# Patient Record
Sex: Male | Born: 1955
Health system: Southern US, Community
[De-identification: ages and names within clinical notes are randomized; demographics above are authoritative.]

## PROBLEM LIST (undated history)

## (undated) DIAGNOSIS — I10 Essential (primary) hypertension: Secondary | ICD-10-CM

## (undated) DIAGNOSIS — R42 Dizziness and giddiness: Secondary | ICD-10-CM

## (undated) DIAGNOSIS — K760 Fatty (change of) liver, not elsewhere classified: Secondary | ICD-10-CM

## (undated) DIAGNOSIS — C61 Malignant neoplasm of prostate: Secondary | ICD-10-CM

## (undated) DIAGNOSIS — E785 Hyperlipidemia, unspecified: Secondary | ICD-10-CM

## (undated) DIAGNOSIS — K219 Gastro-esophageal reflux disease without esophagitis: Secondary | ICD-10-CM

## (undated) DIAGNOSIS — J189 Pneumonia, unspecified organism: Secondary | ICD-10-CM

## (undated) DIAGNOSIS — H269 Unspecified cataract: Secondary | ICD-10-CM

## (undated) DIAGNOSIS — R7302 Impaired glucose tolerance (oral): Secondary | ICD-10-CM

## (undated) DIAGNOSIS — M1990 Unspecified osteoarthritis, unspecified site: Secondary | ICD-10-CM

## (undated) DIAGNOSIS — M797 Fibromyalgia: Secondary | ICD-10-CM

## (undated) DIAGNOSIS — H409 Unspecified glaucoma: Secondary | ICD-10-CM

## (undated) HISTORY — DX: Unspecified osteoarthritis, unspecified site: M19.90

## (undated) HISTORY — PX: OTHER SURGICAL HISTORY: SHX169

## (undated) HISTORY — PX: CHOLECYSTECTOMY: SHX55

## (undated) HISTORY — DX: Unspecified cataract: H26.9

## (undated) HISTORY — PX: EYE SURGERY: SHX253

---

## 1999-06-13 ENCOUNTER — Encounter: Payer: Self-pay | Admitting: Emergency Medicine

## 1999-06-13 ENCOUNTER — Inpatient Hospital Stay (HOSPITAL_COMMUNITY): Admission: EM | Admit: 1999-06-13 | Discharge: 1999-06-14 | Payer: Self-pay

## 1999-06-13 ENCOUNTER — Encounter (HOSPITAL_BASED_OUTPATIENT_CLINIC_OR_DEPARTMENT_OTHER): Payer: Self-pay | Admitting: General Surgery

## 2004-02-16 ENCOUNTER — Ambulatory Visit (HOSPITAL_COMMUNITY): Admission: RE | Admit: 2004-02-16 | Discharge: 2004-02-16 | Payer: Self-pay | Admitting: Gastroenterology

## 2012-04-21 ENCOUNTER — Other Ambulatory Visit: Payer: Self-pay | Admitting: Urology

## 2012-05-15 ENCOUNTER — Other Ambulatory Visit (HOSPITAL_COMMUNITY): Payer: Self-pay | Admitting: Urology

## 2012-05-15 NOTE — Patient Instructions (Addendum)
20 MELECIO CUETO  05/15/2012   Your procedure is scheduled on: 05-27-2012  Report to [facility] at 0630 AM.  Call this number if you have problems the morning of surgery [phone]   Remember:   Do not eat food or drink liquids :After Midnight.     Take these medicines the morning of surgery with A SIP OF WATER:   OMEPRAZOLE (PRILOSEC)  AND USE YOUR EYEDROPS TIMOLOL -- AND BRING YOUR EYEDROPS TO THE HOSPITAL.                                SEE [place] PREPARING FOR SURGERY SHEET   Do not wear jewelry, make-up or nail polish.  Do not wear lotions, powders, or perfumes. You may wear deodorant.   Men may shave face and neck.  Do not bring valuables to the hospital.  Contacts, dentures or bridgework may not be worn into surgery.  Leave suitcase in the car. After surgery it may be brought to your room.  For patients admitted to the hospital, checkout time is 11:00 AM the day of discharge.   Patients discharged the day of surgery will not be allowed to drive home.  Name and phone number of your driver:  Special Instructions: N/A   Please read over the following fact sheets that you were given: MRSA Information, blood fact sheet     FAILURE TO FOLLOW THESE INSTRUCTIONS MAY RESULT IN THE CANCELLATION OF YOUR SURGERY. PATIENT SIGNATURE___________________________________________

## 2012-05-18 ENCOUNTER — Encounter (HOSPITAL_COMMUNITY): Payer: Self-pay | Admitting: Pharmacy Technician

## 2012-05-18 ENCOUNTER — Encounter (HOSPITAL_COMMUNITY): Payer: Self-pay

## 2012-05-18 ENCOUNTER — Other Ambulatory Visit (HOSPITAL_COMMUNITY): Payer: Self-pay | Admitting: Urology

## 2012-05-18 ENCOUNTER — Ambulatory Visit (HOSPITAL_COMMUNITY)
Admission: RE | Admit: 2012-05-18 | Discharge: 2012-05-18 | Disposition: A | Discharge status: Home / Self Care | Payer: Managed Care, Other (non HMO) | Source: Ambulatory Visit | Attending: Urology | Admitting: Urology

## 2012-05-18 ENCOUNTER — Encounter (HOSPITAL_COMMUNITY)
Admission: RE | Admit: 2012-05-18 | Discharge: 2012-05-18 | Disposition: A | Discharge status: Home / Self Care | Payer: Managed Care, Other (non HMO) | Source: Ambulatory Visit | Attending: Urology | Admitting: Urology

## 2012-05-18 DIAGNOSIS — C61 Malignant neoplasm of prostate: Secondary | ICD-10-CM | POA: Insufficient documentation

## 2012-05-18 DIAGNOSIS — Z01810 Encounter for preprocedural cardiovascular examination: Secondary | ICD-10-CM | POA: Insufficient documentation

## 2012-05-18 DIAGNOSIS — Z01812 Encounter for preprocedural laboratory examination: Secondary | ICD-10-CM | POA: Insufficient documentation

## 2012-05-18 HISTORY — DX: Unspecified glaucoma: H40.9

## 2012-05-18 HISTORY — DX: Fatty (change of) liver, not elsewhere classified: K76.0

## 2012-05-18 HISTORY — DX: Gastro-esophageal reflux disease without esophagitis: K21.9

## 2012-05-18 HISTORY — DX: Hyperlipidemia, unspecified: E78.5

## 2012-05-18 HISTORY — DX: Impaired glucose tolerance (oral): R73.02

## 2012-05-18 HISTORY — DX: Essential (primary) hypertension: I10

## 2012-05-18 HISTORY — DX: Fibromyalgia: M79.7

## 2012-05-18 HISTORY — DX: Dizziness and giddiness: R42

## 2012-05-18 HISTORY — DX: Pneumonia, unspecified organism: J18.9

## 2012-05-18 LAB — CBC
HCT: 45 % (ref 39.0–52.0)
Hemoglobin: 15.9 g/dL (ref 13.0–17.0)
MCV: 87.5 fL (ref 78.0–100.0)
RBC: 5.14 MIL/uL (ref 4.22–5.81)
WBC: 6.6 10*3/uL (ref 4.0–10.5)

## 2012-05-18 LAB — SURGICAL PCR SCREEN: Staphylococcus aureus: NEGATIVE

## 2012-05-18 LAB — BASIC METABOLIC PANEL
CO2: 24 mEq/L (ref 19–32)
Chloride: 104 mEq/L (ref 96–112)
Creatinine, Ser: 0.9 mg/dL (ref 0.50–1.35)
Sodium: 138 mEq/L (ref 135–145)

## 2012-05-18 NOTE — Pre-Procedure Instructions (Signed)
PREOP CBC, BMET, EKG, CXR WERE DONE TODAY AT [facility] AS PER ANESTHESIOLOGIST'S GUIDELINES.  T/S WILL BE DONE DAY OF SURGERY.

## 2012-05-26 NOTE — H&P (Signed)
Reason For Visit  Patient presents today for robotic-assisted laparoscopic radical retropubic prostatectomy with pelvic lymph node dissection. His prostate cancer history is summarized below.  History of Present Illness        Mr. Jonathan Washington presents today for a extended prostate cancer discussion.  Again, his PSA was actually within normal limits but elevated for his age and there was some concern about PSA velocity.  Digital rectal exam was unremarkable.  Prostate ultrasound revealed a relatively normal-sized prostate around 38 grams without any worrisome features.  Unfortunately, the patient has had biopsies that were positive.  These were limited to the left side.  The patient had a mixture of Gleason 3+4=7, as well as Gleason 3+3=6 cancer.  Four of the 6 biopsies were positive on the left.  Core involvement was between 10% and 40%.  He has had no problems or complications from the biopsy.  The patient is felt to have intermediate risk clinical stage T2c disease.  SHIM = 22/25   AUA =3/0    Past Medical History Problems  1. History of  Glaucoma 365.9 2. History of  Hypertension 401.9  Surgical History Problems  1. History of  Cholecystectomy 2. History of  Knee Surgery  Current Meds 1. Adult Aspirin Low Strength 81 MG Oral Tablet Dispersible; Therapy: (Recorded:20Sep2013) to 2. Diovan 80 MG Oral Tablet; Therapy: 16Jul2013 to 3. Enalapril Maleate 5 MG Oral Tablet; Therapy: 16Jul2013 to 4. Fenofibrate 160 MG Oral Tablet; Therapy: 16Jul2013 to 5. Imipramine HCl 10 MG Oral Tablet; Therapy: 30Jul2013 to 6. Levofloxacin 500 MG Oral Tablet; 1 po q day beginning the day prior to biopsy; Therapy:  20Sep2013 to (Last Rx:20Sep2013) 7. Timolol Maleate 0.5 % Ophthalmic Solution; Therapy: 26Aug2013 to  Allergies Medication  1. No Known Drug Allergies  Family History Problems  1. Paternal history of  Bladder Cancer V16.52 2. Paternal history of  Death In The Family Father 30yrs,  bladder/throat ca 3. Maternal history of  Death In The Family Mother 76yrs, breast ca 4. Paternal history of  Esophageal Cancer V16.0 5. Family history of  Family Health Status Number Of Children 2 sons  Social History Problems  1. Alcohol Use 0-2 qd 2. Caffeine Use 2 qd 3. Former Smoker V15.82 1 ppd for 106yrs, nonsmoker for the past 48yrs 4. Marital History - Currently Married 5. Occupation: Public house manager  Review of Systems Genitourinary, constitutional, skin, eye, otolaryngeal, hematologic/lymphatic, cardiovascular, pulmonary, endocrine, musculoskeletal, gastrointestinal, neurological and psychiatric system(s) were reviewed and pertinent findings if present are noted.  Genitourinary: nocturia.  Constitutional: feeling tired (fatigue).    Vitals Vital Signs [Data Includes: Last 1 Day]  Blood Pressure: 125 / 86 Temperature: 97.8 F Heart Rate: 81  Physical Exam Constitutional: Well nourished and well developed . No acute distress.  ENT:. The ears and nose are normal in appearance.  Neck: The appearance of the neck is normal and no neck mass is present.  Pulmonary: No respiratory distress and normal respiratory rhythm and effort.  Cardiovascular: Heart rate and rhythm are normal . No peripheral edema.  Abdomen: The abdomen is soft and nontender. No masses are palpated. No CVA tenderness. No hernias are palpable. No hepatosplenomegaly noted.  Rectal: Rectal exam demonstrates normal sphincter tone, no tenderness and no masses. Prostate size is estimated to be 25 g. Normal rectal tone, no rectal masses, prostate is smooth, symmetric and non-tender. The prostate has no nodularity and is not tender. The left seminal vesicle is nonpalpable. The right seminal vesicle is nonpalpable.  The perineum is normal on inspection.  Genitourinary: Examination of the penis demonstrates no discharge, no masses, no lesions and a normal meatus. The scrotum is without lesions. The right epididymis  is palpably normal and non-tender. The left epididymis is palpably normal and non-tender. The right testis is non-tender and without masses. The left testis is non-tender and without masses.  Lymphatics: The femoral and inguinal nodes are not enlarged or tender.  Skin: Normal skin turgor, no visible rash and no visible skin lesions.  Neuro/Psych:. Mood and affect are appropriate.     Assessment Assessed  1. Prostate Cancer 185 2. PSA,Elevated 790.93  Plan PSA,Elevated (790.93)  1. Follow-up Schedule Surgery Office  Follow-up  Done: 23Dec2013  Discussion/Summary  The patient was counseled about the natural history of prostate cancer and the standard treatment options that are available for prostate cancer. It was explained to him how his age and life expectancy, clinical stage, Gleason score, and PSA affect his prognosis, the decision to proceed with additional staging studies, as well as how that information influences recommended treatment strategies. We discussed the roles for active surveillance, radiation therapy, surgical therapy, androgen deprivation, as well as ablative therapy options for the treatment of prostate cancer as appropriate to his individual cancer situation. We discussed the risks and benefits of these options with regard to their impact on cancer control and also in terms of potential adverse events, complications, and impact on quiality of life particularly related to urinary, bowel, and sexual function. The patient was encouraged to ask questions throughout the discussion today and all questions were answered to his stated satisfaction. In addition, the patient was provided with and/or directed to appropriate resources and literature for further education about prostate cancer and treatment options.   We discussed surgical therapy for prostate cancer including the different available surgical approaches. We discussed, in detail, the risks and expectations of surgery with  regard to cancer control, urinary control, and erectile function as well as the expected postoperative recovery process. The risks, potential complications/adverse events of radical prostatectomy as well as alternative options were explained to the patient.   We discussed surgical therapy for prostate cancer including the different available surgical approaches. We discussed, in detail, the risks and expectations of surgery with regard to cancer control, urinary control, and erectile function as well as the expected postoperative recovery process. Additional risks of surgery including but not limited to bleeding, infection, hernia formation, nerve damage, lymphocele formation, bowel/rectal injury potentially necessitating colostomy, damage to the urinary tract resulting in urine leakage, urethral stricture, and the cardiopulmonary risks such as myocardial infarction, stroke, death, venothromboembolism, etc. were explained. The risk of open surgical conversion for robotic/laparoscopic prostatectomy was also discussed.  A total of 50 minutes were spent in the overall care of the patient today with 50 minutes in direct face to face consultation.    Amendment  His today was interested in proceeding with robotic prostatectomy. We would plan on a more aggressive nerve spare on the side without cancer and a limited nerve spare on the side with the positive biopsies.

## 2012-05-27 ENCOUNTER — Encounter (HOSPITAL_COMMUNITY): Payer: Self-pay | Admitting: Anesthesiology

## 2012-05-27 ENCOUNTER — Inpatient Hospital Stay (HOSPITAL_COMMUNITY): Payer: Managed Care, Other (non HMO) | Admitting: Anesthesiology

## 2012-05-27 ENCOUNTER — Encounter (HOSPITAL_COMMUNITY): Payer: Self-pay | Admitting: *Deleted

## 2012-05-27 ENCOUNTER — Observation Stay (HOSPITAL_COMMUNITY)
Admission: RE | Admit: 2012-05-27 | Discharge: 2012-05-28 | Disposition: A | Discharge status: Home / Self Care | Payer: Managed Care, Other (non HMO) | Source: Ambulatory Visit | Attending: Urology | Admitting: Urology

## 2012-05-27 ENCOUNTER — Encounter (HOSPITAL_COMMUNITY)
Admission: RE | Disposition: A | Discharge status: Home / Self Care | Payer: Self-pay | Source: Ambulatory Visit | Attending: Urology

## 2012-05-27 DIAGNOSIS — Z8052 Family history of malignant neoplasm of bladder: Secondary | ICD-10-CM | POA: Insufficient documentation

## 2012-05-27 DIAGNOSIS — I10 Essential (primary) hypertension: Secondary | ICD-10-CM | POA: Insufficient documentation

## 2012-05-27 DIAGNOSIS — C775 Secondary and unspecified malignant neoplasm of intrapelvic lymph nodes: Secondary | ICD-10-CM | POA: Insufficient documentation

## 2012-05-27 DIAGNOSIS — Z7982 Long term (current) use of aspirin: Secondary | ICD-10-CM | POA: Insufficient documentation

## 2012-05-27 DIAGNOSIS — C61 Malignant neoplasm of prostate: Principal | ICD-10-CM | POA: Insufficient documentation

## 2012-05-27 DIAGNOSIS — Z79899 Other long term (current) drug therapy: Secondary | ICD-10-CM | POA: Insufficient documentation

## 2012-05-27 DIAGNOSIS — H409 Unspecified glaucoma: Secondary | ICD-10-CM | POA: Insufficient documentation

## 2012-05-27 DIAGNOSIS — Z800 Family history of malignant neoplasm of digestive organs: Secondary | ICD-10-CM | POA: Insufficient documentation

## 2012-05-27 HISTORY — PX: ROBOT ASSISTED LAPAROSCOPIC RADICAL PROSTATECTOMY: SHX5141

## 2012-05-27 HISTORY — PX: LYMPHADENECTOMY: SHX5960

## 2012-05-27 LAB — TYPE AND SCREEN

## 2012-05-27 LAB — ABO/RH: ABO/RH(D): B NEG

## 2012-05-27 SURGERY — ROBOTIC ASSISTED LAPAROSCOPIC RADICAL PROSTATECTOMY
Anesthesia: General | Wound class: Clean Contaminated

## 2012-05-27 MED ORDER — HYDROMORPHONE HCL PF 1 MG/ML IJ SOLN
INTRAMUSCULAR | Status: AC
  Filled 2012-05-27: qty 1

## 2012-05-27 MED ORDER — MENTHOL 3 MG MT LOZG
1.0000 | LOZENGE | OROMUCOSAL | Status: DC | PRN
  Filled 2012-05-27: qty 9

## 2012-05-27 MED ORDER — MIDAZOLAM HCL 5 MG/5ML IJ SOLN
INTRAMUSCULAR | Status: DC | PRN
  Administered 2012-05-27: 2 mg via INTRAVENOUS

## 2012-05-27 MED ORDER — FENTANYL CITRATE 0.05 MG/ML IJ SOLN
INTRAMUSCULAR | Status: DC | PRN
  Administered 2012-05-27 (×5): 50 ug via INTRAVENOUS

## 2012-05-27 MED ORDER — SODIUM CHLORIDE 0.9 % IV BOLUS (SEPSIS)
1000.0000 mL | Freq: Once | INTRAVENOUS | Status: AC
  Administered 2012-05-27: 1000 mL via INTRAVENOUS

## 2012-05-27 MED ORDER — PHENOL 1.4 % MT LIQD
1.0000 | OROMUCOSAL | Status: DC | PRN
  Filled 2012-05-27: qty 177

## 2012-05-27 MED ORDER — ACETAMINOPHEN 10 MG/ML IV SOLN
1000.0000 mg | Freq: Four times a day (QID) | INTRAVENOUS | Status: AC
  Administered 2012-05-27 (×2): 1000 mg via INTRAVENOUS
  Filled 2012-05-27 (×4): qty 100

## 2012-05-27 MED ORDER — IMIPRAMINE HCL 10 MG PO TABS
10.0000 mg | ORAL_TABLET | Freq: Every day | ORAL | Status: DC
  Administered 2012-05-27: 10 mg via ORAL
  Filled 2012-05-27 (×2): qty 1

## 2012-05-27 MED ORDER — ENALAPRIL MALEATE 5 MG PO TABS
5.0000 mg | ORAL_TABLET | Freq: Every morning | ORAL | Status: DC
  Administered 2012-05-27: 5 mg via ORAL
  Filled 2012-05-27 (×2): qty 1

## 2012-05-27 MED ORDER — HYDROCODONE-ACETAMINOPHEN 5-325 MG PO TABS
1.0000 | ORAL_TABLET | ORAL | Status: DC | PRN
  Administered 2012-05-27: 2 via ORAL
  Administered 2012-05-27: 1 via ORAL
  Filled 2012-05-27: qty 2
  Filled 2012-05-27: qty 1

## 2012-05-27 MED ORDER — BUPIVACAINE-EPINEPHRINE 0.25% -1:200000 IJ SOLN
INTRAMUSCULAR | Status: DC | PRN
  Administered 2012-05-27: 26 mL

## 2012-05-27 MED ORDER — STERILE WATER FOR IRRIGATION IR SOLN
Status: DC | PRN
  Administered 2012-05-27: 3000 mL

## 2012-05-27 MED ORDER — SODIUM CHLORIDE 0.9 % IR SOLN
Status: DC | PRN
  Administered 2012-05-27: 1000 mL

## 2012-05-27 MED ORDER — PROMETHAZINE HCL 25 MG/ML IJ SOLN: 6.2500 mg | INTRAMUSCULAR | Status: DC | PRN

## 2012-05-27 MED ORDER — TIMOLOL MALEATE 0.5 % OP SOLN
1.0000 [drp] | Freq: Two times a day (BID) | OPHTHALMIC | Status: DC
  Administered 2012-05-28: 1 [drp] via OPHTHALMIC
  Filled 2012-05-27: qty 5

## 2012-05-27 MED ORDER — INDIGOTINDISULFONATE SODIUM 8 MG/ML IJ SOLN
INTRAMUSCULAR | Status: DC | PRN
  Administered 2012-05-27 (×2): 5 mL via INTRAVENOUS

## 2012-05-27 MED ORDER — DEXAMETHASONE SODIUM PHOSPHATE 10 MG/ML IJ SOLN
INTRAMUSCULAR | Status: DC | PRN
  Administered 2012-05-27: 10 mg via INTRAVENOUS

## 2012-05-27 MED ORDER — IRBESARTAN 75 MG PO TABS
75.0000 mg | ORAL_TABLET | Freq: Every day | ORAL | Status: DC
  Administered 2012-05-27: 75 mg via ORAL
  Filled 2012-05-27 (×2): qty 1

## 2012-05-27 MED ORDER — HYDROMORPHONE HCL PF 1 MG/ML IJ SOLN
0.2500 mg | INTRAMUSCULAR | Status: DC | PRN
  Administered 2012-05-27 (×3): 0.5 mg via INTRAVENOUS

## 2012-05-27 MED ORDER — FENOFIBRATE 160 MG PO TABS
160.0000 mg | ORAL_TABLET | Freq: Every evening | ORAL | Status: DC
  Administered 2012-05-27: 160 mg via ORAL
  Filled 2012-05-27 (×2): qty 1

## 2012-05-27 MED ORDER — CIPROFLOXACIN HCL 500 MG PO TABS: 500.0000 mg | ORAL_TABLET | Freq: Two times a day (BID) | ORAL | Status: DC

## 2012-05-27 MED ORDER — PROPOFOL 10 MG/ML IV BOLUS
INTRAVENOUS | Status: DC | PRN
  Administered 2012-05-27: 200 mg via INTRAVENOUS

## 2012-05-27 MED ORDER — LACTATED RINGERS IV SOLN
INTRAVENOUS | Status: DC | PRN
  Administered 2012-05-27: 09:00:00

## 2012-05-27 MED ORDER — MORPHINE SULFATE 2 MG/ML IJ SOLN
2.0000 mg | INTRAMUSCULAR | Status: DC | PRN
  Administered 2012-05-27 – 2012-05-28 (×3): 2 mg via INTRAVENOUS
  Filled 2012-05-27 (×3): qty 1

## 2012-05-27 MED ORDER — PANTOPRAZOLE SODIUM 40 MG PO TBEC
40.0000 mg | DELAYED_RELEASE_TABLET | Freq: Every day | ORAL | Status: DC
  Administered 2012-05-28: 40 mg via ORAL
  Filled 2012-05-27: qty 1

## 2012-05-27 MED ORDER — LIDOCAINE HCL (CARDIAC) 20 MG/ML IV SOLN
INTRAVENOUS | Status: DC | PRN
  Administered 2012-05-27: 100 mg via INTRAVENOUS

## 2012-05-27 MED ORDER — KCL IN DEXTROSE-NACL 10-5-0.45 MEQ/L-%-% IV SOLN
INTRAVENOUS | Status: AC
  Filled 2012-05-27: qty 1000

## 2012-05-27 MED ORDER — LACTATED RINGERS IV SOLN
INTRAVENOUS | Status: DC | PRN
  Administered 2012-05-27 (×2): via INTRAVENOUS

## 2012-05-27 MED ORDER — SUCCINYLCHOLINE CHLORIDE 20 MG/ML IJ SOLN
INTRAMUSCULAR | Status: DC | PRN
  Administered 2012-05-27: 100 mg via INTRAVENOUS

## 2012-05-27 MED ORDER — HYDROCODONE-ACETAMINOPHEN 5-325 MG PO TABS: 1.0000 | ORAL_TABLET | Freq: Four times a day (QID) | ORAL | Status: DC | PRN

## 2012-05-27 MED ORDER — GLYCOPYRROLATE 0.2 MG/ML IJ SOLN
INTRAMUSCULAR | Status: DC | PRN
  Administered 2012-05-27: 0.6 mg via INTRAVENOUS

## 2012-05-27 MED ORDER — EPHEDRINE SULFATE 50 MG/ML IJ SOLN
INTRAMUSCULAR | Status: DC | PRN
  Administered 2012-05-27: 5 mg via INTRAVENOUS

## 2012-05-27 MED ORDER — KCL IN DEXTROSE-NACL 10-5-0.45 MEQ/L-%-% IV SOLN
INTRAVENOUS | Status: DC
  Administered 2012-05-27 – 2012-05-28 (×3): via INTRAVENOUS
  Filled 2012-05-27 (×5): qty 1000

## 2012-05-27 MED ORDER — NEOSTIGMINE METHYLSULFATE 1 MG/ML IJ SOLN
INTRAMUSCULAR | Status: DC | PRN
  Administered 2012-05-27: 4 mg via INTRAVENOUS

## 2012-05-27 MED ORDER — ACETAMINOPHEN 10 MG/ML IV SOLN
INTRAVENOUS | Status: DC | PRN
  Administered 2012-05-27: 1000 mg via INTRAVENOUS

## 2012-05-27 MED ORDER — ROCURONIUM BROMIDE 100 MG/10ML IV SOLN
INTRAVENOUS | Status: DC | PRN
  Administered 2012-05-27: 50 mg via INTRAVENOUS
  Administered 2012-05-27 (×2): 10 mg via INTRAVENOUS
  Administered 2012-05-27: 5 mg via INTRAVENOUS

## 2012-05-27 MED ORDER — CEFAZOLIN SODIUM-DEXTROSE 2-3 GM-% IV SOLR
2.0000 g | INTRAVENOUS | Status: AC
  Administered 2012-05-27: 2 g via INTRAVENOUS

## 2012-05-27 MED ORDER — ONDANSETRON HCL 4 MG/2ML IJ SOLN
INTRAMUSCULAR | Status: DC | PRN
  Administered 2012-05-27: 4 mg via INTRAVENOUS

## 2012-05-27 MED ORDER — ZOLPIDEM TARTRATE 5 MG PO TABS
5.0000 mg | ORAL_TABLET | Freq: Every evening | ORAL | Status: DC | PRN
  Administered 2012-05-27: 5 mg via ORAL
  Filled 2012-05-27: qty 1

## 2012-05-27 SURGICAL SUPPLY — 49 items
CANISTER SUCTION 2500CC (MISCELLANEOUS) ×3 IMPLANT
CATH FOLEY 2WAY SLVR  5CC 20FR (CATHETERS)
CATH FOLEY 2WAY SLVR 18FR 30CC (CATHETERS) ×3 IMPLANT
CATH FOLEY 2WAY SLVR 5CC 20FR (CATHETERS) IMPLANT
CATH ROBINSON RED A/P 16FR (CATHETERS) ×3 IMPLANT
CATH ROBINSON RED A/P 8FR (CATHETERS) ×3 IMPLANT
CATH TIEMANN FOLEY 18FR 5CC (CATHETERS) ×3 IMPLANT
CHLORAPREP W/TINT 26ML (MISCELLANEOUS) ×3 IMPLANT
CLIP LIGATING HEM O LOK PURPLE (MISCELLANEOUS) IMPLANT
CLOTH BEACON ORANGE TIMEOUT ST (SAFETY) ×3 IMPLANT
CORD HIGH FREQUENCY UNIPOLAR (ELECTROSURGICAL) ×3 IMPLANT
COVER SURGICAL LIGHT HANDLE (MISCELLANEOUS) ×6 IMPLANT
COVER TIP SHEARS 8 DVNC (MISCELLANEOUS) ×2 IMPLANT
COVER TIP SHEARS 8MM DA VINCI (MISCELLANEOUS) ×1
CUTTER ECHEON FLEX ENDO 45 340 (ENDOMECHANICALS) ×3 IMPLANT
DECANTER SPIKE VIAL GLASS SM (MISCELLANEOUS) ×3 IMPLANT
DRAPE SURG IRRIG POUCH 19X23 (DRAPES) ×3 IMPLANT
DRAPE UTILITY 15X26 (DRAPE) ×3 IMPLANT
DRSG TEGADERM 2-3/8X2-3/4 SM (GAUZE/BANDAGES/DRESSINGS) ×15 IMPLANT
DRSG TEGADERM 4X4.75 (GAUZE/BANDAGES/DRESSINGS) ×6 IMPLANT
DRSG TEGADERM 6X8 (GAUZE/BANDAGES/DRESSINGS) ×6 IMPLANT
ELECT REM PT RETURN 9FT ADLT (ELECTROSURGICAL) ×3
ELECTRODE REM PT RTRN 9FT ADLT (ELECTROSURGICAL) ×2 IMPLANT
GAUZE SPONGE 2X2 8PLY STRL LF (GAUZE/BANDAGES/DRESSINGS) IMPLANT
GLOVE BIO SURGEON STRL SZ 6.5 (GLOVE) ×3 IMPLANT
GLOVE BIOGEL M STRL SZ7.5 (GLOVE) ×6 IMPLANT
GOWN PREVENTION PLUS XLARGE (GOWN DISPOSABLE) ×6 IMPLANT
GOWN STRL NON-REIN LRG LVL3 (GOWN DISPOSABLE) ×3 IMPLANT
GOWN STRL REIN XL XLG (GOWN DISPOSABLE) ×3 IMPLANT
HOLDER FOLEY CATH W/STRAP (MISCELLANEOUS) ×3 IMPLANT
IV LACTATED RINGERS 1000ML (IV SOLUTION) IMPLANT
KIT ACCESSORY DA VINCI DISP (KITS) ×1
KIT ACCESSORY DVNC DISP (KITS) ×2 IMPLANT
NDL SAFETY ECLIPSE 18X1.5 (NEEDLE) ×2 IMPLANT
NEEDLE HYPO 18GX1.5 SHARP (NEEDLE) ×1
PACK ROBOT UROLOGY CUSTOM (CUSTOM PROCEDURE TRAY) ×3 IMPLANT
RELOAD GREEN ECHELON 45 (STAPLE) ×3 IMPLANT
SCISSORS LAP 5X35 DISP (ENDOMECHANICALS) ×3 IMPLANT
SEALER TISSUE G2 CVD JAW 45CM (ENDOMECHANICALS) ×3 IMPLANT
SET TUBE IRRIG SUCTION NO TIP (IRRIGATION / IRRIGATOR) ×3 IMPLANT
SOLUTION ELECTROLUBE (MISCELLANEOUS) ×3 IMPLANT
SPONGE GAUZE 2X2 STER 10/PKG (GAUZE/BANDAGES/DRESSINGS)
SUT ETHILON 3 0 PS 1 (SUTURE) ×3 IMPLANT
SUT VIC AB 2-0 SH 27 (SUTURE) ×1
SUT VIC AB 2-0 SH 27X BRD (SUTURE) ×2 IMPLANT
SUT VICRYL 0 UR6 27IN ABS (SUTURE) ×3 IMPLANT
SYR 27GX1/2 1ML LL SAFETY (SYRINGE) ×3 IMPLANT
TOWEL OR NON WOVEN STRL DISP B (DISPOSABLE) ×3 IMPLANT
WATER STERILE IRR 1500ML POUR (IV SOLUTION) IMPLANT

## 2012-05-27 NOTE — Care Management Note (Signed)
    Page 1 of 1   05/28/2012     11:42:30 AM   CARE MANAGEMENT NOTE 05/28/2012  Patient:  Jonathan Washington, Jonathan Washington   Account Number:  [id number]  Date Initiated:  05/27/2012  Documentation initiated by:  Lanier Clam  Subjective/Objective Assessment:   ADMITTED W/PROSTATE CA.     Action/Plan:   FROM HOME W/SPOUSE.HAS PCP,PHARMACY.   Anticipated DC Date:  05/28/2012   Anticipated DC Plan:  HOME/SELF CARE      DC Planning Services  CM consult      Choice offered to / List presented to:             Status of service:  Completed, signed off Medicare Important Message given?   (If response is "NO", the following Medicare IM given date fields will be blank) Date Medicare IM given:   Date Additional Medicare IM given:    Discharge Disposition:  HOME/SELF CARE  Per UR Regulation:  Reviewed for med. necessity/level of care/duration of stay  If discussed at Long Length of Stay Meetings, dates discussed:    Comments:  05/28/12 Jontrell Bushong RN,BSN NCM [phone] D/C HOME NO NEEDS OR ORDERS.  05/27/12 Jerald Villalona RN,BSN NCM [phone] S/P ROBOTIC LAP ASST RADICAL RETROPUBIC PROSTATECTOMY.

## 2012-05-27 NOTE — Transfer of Care (Signed)
Immediate Anesthesia Transfer of Care Note  Patient: Jonathan Washington  Procedure(s) Performed: Procedure(s) (LRB) with comments: ROBOTIC ASSISTED LAPAROSCOPIC RADICAL PROSTATECTOMY (N/A) LYMPHADENECTOMY (Bilateral)  Patient Location: PACU  Anesthesia Type:General  Level of Consciousness: sedated  Airway & Oxygen Therapy: Patient Spontanous Breathing and Patient connected to face mask oxygen  Post-op Assessment: Report given to PACU RN and Post -op Vital signs reviewed and stable  Post vital signs: Reviewed and stable  Complications: No apparent anesthesia complications

## 2012-05-27 NOTE — Interval H&P Note (Signed)
History and Physical Interval Note:  05/27/2012 8:21 AM  Jonathan Washington  has presented today for surgery, with the diagnosis of PROSTATE CANCER  The various methods of treatment have been discussed with the patient and family. After consideration of risks, benefits and other options for treatment, the patient has consented to  Procedure(s) (LRB) with comments: ROBOTIC ASSISTED LAPAROSCOPIC RADICAL PROSTATECTOMY (N/A) - 3.5 HRS BILATERAL PELVIC LYMPH NODE DISSECTION  LYMPHADENECTOMY (Bilateral) as a surgical intervention .  The patient's history has been reviewed, patient examined, no change in status, stable for surgery.  I have reviewed the patient's chart and labs.  Questions were answered to the patient's satisfaction.     Haik Mahoney S

## 2012-05-27 NOTE — Op Note (Signed)
Preoperative diagnosis: Clinical stage T1c Adenocarcinoma prostate  Postoperative diagnosis: Same  Procedure: Robotic-assisted laparoscopic radical retropubic prostatectomy with bilateral pelvic lymph node dissection  Surgeon: Valetta Fuller, MD  Asst.: Pecola Leisure, PA Anesthesia: Gen. Endotracheal  Indications: Patient was diagnosed with clinical stage TIc Adenocarcinoma the prostate. He underwent extensive consultation with regard to treatment options. The patient decided on a surgical approach. He appeared to understand the distinct advantages as well as the disadvantages of this procedure. The patient has performed a mechanical bowel prep. He has had placement of PAS compression boots and has received perioperative antibiotics. The patient's preoperative PSA was 3-4. Ultrasound revealed a 38 g prostate.   Technique and findings:The patient was brought to the operating room and had successful induction of general endotracheal anesthesia.the patient was placed in a low lithotomy position with careful padding of all extremities. He was secured to the operative table and placed in the steep Trendelenburg position. He was prepped and draped in usual manner. A Foley catheter was placed sterilely on the field. Camera port site was chosen 18 cm above the pubic symphysis just to the left of the umbilicus. A standard open Hassan technique was utilized. A 12 mm trocar was placed without difficulty. The camera was then inserted and no abnormalities were noted within the pelvis. The trochars were placed with direct visual guidance. This included 3 8mm robotic trochars and a 12 mm and 5 mm assist ports. Once all the ports were placed the robot was docked. The bladder was filled and the space of Retzius was developed with electrocautery dissection as well as blunt dissection. Superficial fat off the endopelvic fascia and bladder neck was removed with electrocautery scissors. The endopelvic fascia was then incised  bilaterally from base to apex. Levator musculature was swept off the apex of the prostate isolating the dorsal venous complex which was then stapled with the ETS stapling device. The anterior bladder neck was identified with the aid of the Foley balloon. This was then transected down to the Foley catheter with electrocautery scissors. The Foley catheter was then retracted anteriorly. Indigo carmine was given and we appeared to be well away from the ureteral orifices. The posterior bladder neck was then transected and the dissection carried down to the adnexal structures. The seminal vesicles and vas deferens on both sides were then individually dissected free and retracted anteriorly. The posterior plane between the rectum and prostate was then established primarily with blunt dissection.  Attention was then turned towards nerve sparing. The patient was felt to be a candidate for right-sided nerve sparing and limited left-sided nerve sparing. Superficial fascia along the anterior lateral aspect of the prostate was incised bilaterally. This tissue was then swept laterally until we were able to establish a groove between the neurovascular tissue and the posterior lateral aspect on the prostate bilaterally. This groove was then extended from the apex back to the base of the prostate. With the prostate retracted anteriorly the vascular pedicles of the prostate were taken with the Enseal device. The Foley catheter was then reinserted and the anterior urethra was transected. The posterior urethra was then transected as were some rectourethralis fibers. The prostate was then removed from the pelvis. The pelvis was then copiously irrigated. Rectal insufflation was performed and there was no evidence of rectal injury.  Attention was then turned towards bilateral pelvic lymph node dissection. The obturator node packets were removed I laterally and the dissection extended towards the bifurcation of the iliac artery. The  obturator nerve was identified on both sides and preserved. Hemalock clips were used for small veins and lymphatic channels. The node packets were sent for permanent analysis.  Attention was then turned towards reconstruction. The bladder neck did not require any reconstruction. The bladder neck and posterior urethra were reapproximated at the 6:00 position utilizing a 2-0 Vicryl suture. The rest of the anastomosis was done with a double-armed 3-0 Monocryl suture in a 360 degree manner. Additional indigo carmine was given. A new catheter was placed and bladder irrigation revealed no evidence of leakage. A Blake drain was placed through one of the robotic trochars and positioned in the retropubic space above the anastomosis. This was then secured to the skin with a nylon suture. The prostate was placed in the Endopouch retrieval bag. The 12 mm trocar site was closed with a Vicryl suture with the aid of a suture passer. Our other trochars were taken out with direct visual guidance without evidence of any bleeding. The camera port incision was extended slightly to allow for removal of the specimen and then closed with a running Vicryl suture. All port sites were infiltrated with Marcaine and then closed with surgical clips. The patient was then taken to recovery room having had no obvious complications or problems. Sponge and needle counts were correct.

## 2012-05-27 NOTE — Progress Notes (Signed)
Patient ID: TALAL FRITCHMAN, male   DOB: 22-Jul-1955, 57 y.o.   MRN: [id number] Post-op note  Subjective: The patient is doing well.  No complaints.  Objective: Vital signs in last 24 hours: Temp:  [97.3 F (36.3 C)-97.9 F (36.6 C)] 97.9 F (36.6 C) (02/05 1234) Pulse Rate:  [69-83] 73  (02/05 1234) Resp:  [11-22] 16  (02/05 1234) BP: (104-124)/(69-81) 117/74 mmHg (02/05 1234) SpO2:  [99 %-100 %] 100 % (02/05 1234)  Intake/Output from previous day:   Intake/Output this shift: Total I/O In: 2400 [I.V.:1400; IV Piggyback:1000] Out: 240 [Urine:80; Drains:60; Blood:100]  Physical Exam:  General: Alert and oriented. Abdomen: Soft, Nondistended. Incisions: Clean and dry.  Lab Results:  Basename 05/27/12 1150  HGB 13.2  HCT 37.6*    Assessment/Plan: POD#0   1) Continue to monitor 2.) amb, IS, pain control, DVT prophy    LOS: 0 days   Silas Flood. 05/27/2012, 2:17 PM

## 2012-05-27 NOTE — Anesthesia Preprocedure Evaluation (Signed)
Anesthesia Evaluation  Patient identified by MRN, date of birth, ID band Patient awake    Reviewed: Allergy & Precautions, H&P , NPO status , Patient's Chart, lab work & pertinent test results  Airway Mallampati: II TM Distance: >3 FB Neck ROM: Full    Dental No notable dental hx.    Pulmonary neg pulmonary ROS,  breath sounds clear to auscultation  Pulmonary exam normal       Cardiovascular hypertension, Pt. on medications Rhythm:Regular Rate:Normal     Neuro/Psych negative neurological ROS  negative psych ROS   GI/Hepatic Neg liver ROS, GERD-  ,  Endo/Other  negative endocrine ROS  Renal/GU negative Renal ROS  negative genitourinary   Musculoskeletal negative musculoskeletal ROS (+)   Abdominal   Peds negative pediatric ROS (+)  Hematology negative hematology ROS (+)   Anesthesia Other Findings   Reproductive/Obstetrics negative OB ROS                           Anesthesia Physical Anesthesia Plan  ASA: II  Anesthesia Plan: General   Post-op Pain Management:    Induction: Intravenous  Airway Management Planned: Oral ETT  Additional Equipment:   Intra-op Plan:   Post-operative Plan: Extubation in OR  Informed Consent: I have reviewed the patients History and Physical, chart, labs and discussed the procedure including the risks, benefits and alternatives for the proposed anesthesia with the patient or authorized representative who has indicated his/her understanding and acceptance.   Dental advisory given  Plan Discussed with: CRNA and Surgeon  Anesthesia Plan Comments:         Anesthesia Quick Evaluation  

## 2012-05-27 NOTE — Anesthesia Procedure Notes (Signed)
Procedure Name: Intubation Date/Time: 05/27/2012 8:34 AM Performed by: Doran Clay Pre-anesthesia Checklist: Patient identified, Timeout performed, Emergency Drugs available, Suction available and Patient being monitored Patient Re-evaluated:Patient Re-evaluated prior to inductionOxygen Delivery Method: Circle system utilized Preoxygenation: Pre-oxygenation with 100% oxygen Intubation Type: IV induction Ventilation: Mask ventilation without difficulty Laryngoscope Size: Mac and 4 Grade View: Grade I Tube type: Oral Tube size: 7.5 mm Number of attempts: 1 Airway Equipment and Method: Stylet Placement Confirmation: ETT inserted through vocal cords under direct vision,  breath sounds checked- equal and bilateral and positive ETCO2 Secured at: 23 cm Tube secured with: Tape Dental Injury: Teeth and Oropharynx as per pre-operative assessment

## 2012-05-28 ENCOUNTER — Encounter (HOSPITAL_COMMUNITY): Payer: Self-pay | Admitting: Urology

## 2012-05-28 LAB — BASIC METABOLIC PANEL
BUN: 10 mg/dL (ref 6–23)
CO2: 26 mEq/L (ref 19–32)
Chloride: 103 mEq/L (ref 96–112)
GFR calc non Af Amer: >90 mL/min (ref >90)
Glucose, Bld: 153 mg/dL — ABNORMAL HIGH (ref 70–99)
Potassium: 3.8 mEq/L (ref 3.5–5.1)
Sodium: 137 mEq/L (ref 135–145)

## 2012-05-28 LAB — CREATININE, FLUID (PLEURAL, PERITONEAL, JP DRAINAGE): Creat, Fluid: 0.8 mg/dL

## 2012-05-28 LAB — HEMOGLOBIN AND HEMATOCRIT, BLOOD: HCT: 38.6 % — ABNORMAL LOW (ref 39.0–52.0)

## 2012-05-28 MED ORDER — BISACODYL 10 MG RE SUPP
10.0000 mg | Freq: Once | RECTAL | Status: AC
  Administered 2012-05-28: 10 mg via RECTAL
  Filled 2012-05-28: qty 1

## 2012-05-28 MED ORDER — OXYCODONE HCL 5 MG PO TABS
10.0000 mg | ORAL_TABLET | ORAL | Status: DC | PRN
  Administered 2012-05-28 (×2): 10 mg via ORAL
  Filled 2012-05-28 (×2): qty 2

## 2012-05-28 NOTE — Progress Notes (Signed)
1 Day Post-Op Subjective: Patient reports pain control adequate. Need Oxy IR due to max tylenol  Objective: Vital signs in last 24 hours: Temp:  [97.4 F (36.3 C)-98.4 F (36.9 C)] 98.1 F (36.7 C) (02/06 0511) Pulse Rate:  [64-87] 79  (02/06 0511) Resp:  [11-22] 16  (02/06 0511) BP: (85-124)/(51-81) 94/56 mmHg (02/06 0511) SpO2:  [96 %-100 %] 96 % (02/06 0511) Weight:  [89.359 kg (197 lb)-91.082 kg (200 lb 12.8 oz)] 89.359 kg (197 lb) (02/05 1900)  Intake/Output from previous day: 02/05 0701 - 02/06 0700 In: 6332.1 [P.O.:1680; I.V.:3652.1; IV Piggyback:1000] Out: 6410 [Urine:6105; Drains:205; Blood:100] Intake/Output this shift: Total I/O In: -  Out: 900 [Urine:900]  Physical Exam:  General:alert and cooperative Cardiovascular: rrr Lungs: nl effort GI: not done and soft, non tender, normal bowel sounds, no palpable masses, no organomegaly, no inguinal hernia Incisions:  Urine: Extremities:  Lab Results:  Basename 05/28/12 0501 05/27/12 1150  HGB 13.7 13.2  HCT 38.6* 37.6*   BMET  Basename 05/28/12 0501  NA 137  K 3.8  CL 103  CO2 26  GLUCOSE 153*  BUN 10  CREATININE 0.84  CALCIUM 8.3*   No results found for this basename: LABPT:3,INR:3 in the last 72 hours No results found for this basename: LABURIN:1 in the last 72 hours Results for orders placed during the hospital encounter of 05/18/12  SURGICAL PCR SCREEN     Status: Normal   Collection Time   05/18/12  1:40 PM      Component Value Range Status Comment   MRSA, PCR NEGATIVE  NEGATIVE Final    Staphylococcus aureus NEGATIVE  NEGATIVE Final     Studies/Results: No results found.  Assessment/Plan: 1 Day Post-Op, Procedure(s) (LRB): ROBOTIC ASSISTED LAPAROSCOPIC RADICAL PROSTATECTOMY (N/A) LYMPHADENECTOMY (Bilateral)  Transition to PO pain medications Doubt leak but will check JP creatinine and then probably DC Probable DC home later today   LOS: 1 day   Leslea Vowles S 05/28/2012, 7:46  AM

## 2012-05-28 NOTE — Discharge Summary (Signed)
  Date of admission: 05/27/2012  Date of discharge: 05/28/2012  Admission diagnosis: Prostate Cancer  Discharge diagnosis: Prostate Cancer  History and Physical: For full details, please see admission history and physical. Briefly, Jonathan Washington is a 57 y.o. gentleman with localized prostate cancer.  After discussing management/treatment options, he elected to proceed with surgical treatment.  Hospital Course: HAZAEL OLVEDA was taken to the operating room on 05/27/2012 and underwent a robotic assisted laparoscopic radical prostatectomy. He tolerated this procedure well and without complications. Postoperatively, he was able to be transferred to a regular hospital room following recovery from anesthesia.  He was able to begin ambulating the night of surgery. He remained hemodynamically stable overnight.  He had excellent urine output with appropriately minimal output from his pelvic drain and his pelvic drain was removed on POD #1.  He was transitioned to oral pain medication, tolerated a clear liquid diet, and had met all discharge criteria and was able to be discharged home later on POD#1.  Laboratory values:  Basename 05/28/12 0501 05/27/12 1150  HGB 13.7 13.2  HCT 38.6* 37.6*    Disposition: Home  Discharge instruction: He was instructed to be ambulatory but to refrain from heavy lifting, strenuous activity, or driving. He was instructed on urethral catheter care.  Discharge medications:     Medication List     As of 05/28/2012 11:11 AM    START taking these medications         ciprofloxacin 500 MG tablet   Commonly known as: CIPRO   Take 1 tablet (500 mg total) by mouth 2 (two) times daily. Start day prior to office visit for foley removal      HYDROcodone-acetaminophen 5-325 MG per tablet   Commonly known as: NORCO/VICODIN   Take 1-2 tablets by mouth every 6 (six) hours as needed for pain.      CONTINUE taking these medications         enalapril 5 MG tablet   Commonly known as: VASOTEC      fenofibrate 160 MG tablet      imipramine 10 MG tablet   Commonly known as: TOFRANIL      omeprazole 20 MG capsule   Commonly known as: PRILOSEC      timolol 0.5 % ophthalmic solution   Commonly known as: TIMOPTIC      valsartan 80 MG tablet   Commonly known as: DIOVAN      STOP taking these medications         aspirin EC 81 MG tablet      ibuprofen 200 MG tablet   Commonly known as: ADVIL,MOTRIN          Where to get your medications    These are the prescriptions that you need to pick up.   You may get these medications from any pharmacy.         ciprofloxacin 500 MG tablet   HYDROcodone-acetaminophen 5-325 MG per tablet            Followup: He will followup in 1 week for catheter removal and to discuss his surgical pathology results.

## 2012-06-08 NOTE — Anesthesia Postprocedure Evaluation (Signed)
  Anesthesia Post-op Note  Patient: Jonathan Washington  Procedure(s) Performed: Procedure(s) (LRB): ROBOTIC ASSISTED LAPAROSCOPIC RADICAL PROSTATECTOMY (N/A) LYMPHADENECTOMY (Bilateral)  Patient Location: PACU  Anesthesia Type: General  Level of Consciousness: awake and alert   Airway and Oxygen Therapy: Patient Spontanous Breathing  Post-op Pain: mild  Post-op Assessment: Post-op Vital signs reviewed, Patient's Cardiovascular Status Stable, Respiratory Function Stable, Patent Airway and No signs of Nausea or vomiting  Last Vitals:  Filed Vitals:   05/28/12 1350  BP: 127/82  Pulse: 79  Temp: 36.3 C  Resp: 20    Post-op Vital Signs: stable   Complications: No apparent anesthesia complications

## 2017-05-15 ENCOUNTER — Encounter: Payer: Federal, State, Local not specified - PPO | Attending: Family Medicine | Admitting: Registered"

## 2017-05-15 ENCOUNTER — Encounter: Payer: Self-pay | Admitting: Registered"

## 2017-05-15 DIAGNOSIS — Z713 Dietary counseling and surveillance: Secondary | ICD-10-CM | POA: Insufficient documentation

## 2017-05-15 DIAGNOSIS — R7301 Impaired fasting glucose: Secondary | ICD-10-CM | POA: Diagnosis not present

## 2017-05-15 NOTE — Patient Instructions (Signed)
Make sure to get in three meals per day. Try to have balanced meals like the plate example. Have a consistent intake of carbohydrates at meals-recommend 4-5 carbohydrate choices per meal. Could do 3-4 per meal if 4-5 is too much food, but want to keep it consistent at each meal. 1 carbohydrate paired with protein for a snack.    Try to include heart healthy fats (see handout) and less saturated fat.   Strongly recommend avoiding alcohol to help with lowering triglycerides and for liver health.   Calorieking.com -Good resource to check nutrition at restaurants.   Continue making physical activity a part of your week. Try to include at least 30 minutes of physical activity 5 days each week or at least 150 minutes per week. Regular physical activity promotes overall health-including helping to reduce risk for heart disease and diabetes, promoting mental health, and helping [place] sleep better.

## 2017-05-15 NOTE — Progress Notes (Signed)
Medical Nutrition Therapy:  Appt start time: 1600 end time:  8315.   Assessment:  Primary concerns today: Pt referred due to impaired fasting glucose. Noted pt has a medical hx of fatty liver, HTN, and dyslipidemia. Pt present for appointment with wife. Pt goes by Jonathan Washington. Wife reports that pt's last A1c was WNL but pt has had two fasting glucose readings above 126 and pt's doctor talked to them about new dx of type 2 diabetes per pt's wife. Pt and wife report that they try to eat healthy. Pt reports tiredness. He is unsure if he has ever had an occurrence of hypoglycemia. Reports he may have at some time while working. Pt wants to know if he should start checking his blood sugar at home. Pt reports that he drinks alcohol-2-3 beers about 2 times per week. Pt reports that he does not like to he [place] food without drinking beer with it. Pt reports that he takes pomegranate extract and drinks pomegranate juice.   Preferred Learning Style:   No preference indicated   Learning Readiness:   Ready  MEDICATIONS: See list.    DIETARY INTAKE:  Usual eating pattern includes 1-2 meals and 2 snacks per day. Typical snacks include fruit, protein bar, popcorn, lightly salted nuts.   Everyday foods vary.  Avoided foods include pineapple, ground [place]. Beverages include water, Diet soda, sometimes lite beer about 2 times per week-2-3 beers per pt.    24-hr recall:  B ( AM): 2 pieces of bacon, 2 fried eggs in olive oil, 2 cups coffee with half and half and stevia, Pomegranate Juice Snk ( AM): None reported.  L ( PM): egg salad on whole wheat 45 kcal per slice bread, Veggie Straws, granola bar, Diet soda  Snk ( PM): Pure protein bar D ( PM): leftover [place] fajita, pinto beans, wild rice, salad with 1 tbsp ranch dressing, water, 1 piece sweet potato pie   Snk ( PM): orange Beverages: coffee, pomegranate juice, diet soda, water   Usual physical activity: class at [facility]- 2 days per for 1 hour-week-cardio,  core, and toning; walk about 30 minutes 3 days per week, outdoor work as weather permits.   Estimated energy needs: 2000-2200 calories 225-248 g carbohydrates 125-138 g protein 67-73 g fat   Progress Towards Goal(s):  In progress.   Nutritional Diagnosis:  NB-1.1 Food and nutrition-related knowledge deficit As related to carbohydrate counting, and diabetes self management.  As evidenced by pt unaware of which foods contain carbohydrates, how to read a nutrition label to assess carbohydrate content, and relationship between carbohydrate intake and blood sugar.    Intervention:  Nutrition counseling provided. Dietitian provided diabetes self management education-carbohydrate counting, label reading, hypoglycemia and hyperglycemia and management, relationship between dietary intake and exercise on blood sugar, and heart healthy nutrition. Discussed checking feet regularly. Discussed that pt checking blood sugar at home could be very helpful in blood sugar management. Pt plans to do so. Discussed effects of alcohol on liver and recommended pt avoid alcohol. Discussed that pomegranate juice can interact with some of pt's medications.   Goals/Instructions:   Make sure to get in three meals per day. Try to have balanced meals like the plate example. Have a consistent intake of carbohydrates at meals-recommend 4-5 carbohydrate choices per meal. Could do 3-4 per meal if 4-5 is too much food, but want to keep it consistent at each meal. 1 carbohydrate paired with protein for a snack.    Try to include heart healthy  fats (see handout) and less saturated fat.   Strongly recommend avoiding alcohol to help with lowering triglycerides and for liver health.   Calorieking.com -Good resource to check nutrition at restaurants.   Continue making physical activity a part of your week. Try to include at least 30 minutes of physical activity 5 days each week or at least 150 minutes per week. Regular physical  activity promotes overall health-including helping to reduce risk for heart disease and diabetes, promoting mental health, and helping [place] sleep better.    Teaching Method Utilized:  Visual Auditory Handouts given during visit include:  Living Well with Diabetes   Heart Healthy Nutrition   Low Carbohydrate Snacks   Barriers to learning/adherence to lifestyle change: None indicated.   Demonstrated degree of understanding via:  Teach Back   Monitoring/Evaluation:  Dietary intake, exercise, and body weight prn.

## 2018-01-06 DIAGNOSIS — R42 Dizziness and giddiness: Secondary | ICD-10-CM | POA: Insufficient documentation

## 2018-01-06 DIAGNOSIS — H903 Sensorineural hearing loss, bilateral: Secondary | ICD-10-CM | POA: Insufficient documentation

## 2018-07-09 ENCOUNTER — Other Ambulatory Visit: Payer: Self-pay | Admitting: Optometry

## 2018-08-10 ENCOUNTER — Encounter: Payer: Self-pay | Admitting: Radiation Oncology

## 2018-08-10 NOTE — Progress Notes (Signed)
GU Location of Tumor / Histology: Biochemical recurrent prostate cancer  If Prostate Cancer, Gleason Score is (3 + 3) and PSA is (3.7)pre treatment.   06/23/2018 PSA  0.36  Jonathan Washington was diagnosed with prostate cancer in 2014. He had a prostatectomy the same year. Unfortunately, a positive node was found.       Past/Anticipated interventions by urology, if any: prostatectomy (05/2012), active surveillance, denies that any imaging has ever been done, referral for consideration of radiotherapy  Past/Anticipated interventions by medical oncology, if any: no  Weight changes, if any: no  Bowel/Bladder complaints, if any: IPSS 7. SHIM 18. Sexually active with aid of Viagra. Denies urinary incontinence. Reports minimal leakage. Denies dysuria or hematuria. Reports a weak urine stream and nocturia.    Nausea/Vomiting, if any: no  Pain issues, if any:  no  SAFETY ISSUES:  Prior radiation? no  Pacemaker/ICD? no  Possible current pregnancy? no, male patient  Is the patient on methotrexate? no  Current Complaints / other details:  63 year old male. NKDA. Mother and father both with hx of ca.

## 2018-08-11 ENCOUNTER — Other Ambulatory Visit: Payer: Self-pay

## 2018-08-11 ENCOUNTER — Encounter: Payer: Self-pay | Admitting: Radiation Oncology

## 2018-08-11 ENCOUNTER — Ambulatory Visit
Admission: RE | Admit: 2018-08-11 | Discharge: 2018-08-11 | Disposition: A | Discharge status: Home / Self Care | Payer: Medicare Other | Source: Ambulatory Visit | Attending: Radiation Oncology | Admitting: Radiation Oncology

## 2018-08-11 ENCOUNTER — Other Ambulatory Visit: Payer: Self-pay | Admitting: Urology

## 2018-08-11 VITALS — Ht 71.0 in | Wt 202.0 lb

## 2018-08-11 DIAGNOSIS — C61 Malignant neoplasm of prostate: Secondary | ICD-10-CM | POA: Insufficient documentation

## 2018-08-11 HISTORY — DX: Malignant neoplasm of prostate: C61

## 2018-08-11 NOTE — Progress Notes (Signed)
Radiation Oncology         [phone] ________________________________  Initial Outpatient Consultation - Conducted via WebEx due to current COVID-19 concerns for limiting patient exposure  Name: Jonathan Washington MRN: [id number]  Date: 08/11/2018  DOB: [birth year]  PP:IRJJOA, Lennette Bihari, MD  Hulan Fess, MD   REFERRING PHYSICIAN: Hulan Fess, MD  DIAGNOSIS: 63 y.o. gentleman with a biochemical recurrence of pT2b N1 MX Gleason 3+3 adenocarcinoma of the prostate with current PSA of 0.36.    ICD-10-CM   1. Malignant neoplasm of prostate ([place]) C61     HISTORY OF PRESENT ILLNESS: Jonathan Washington is a 63 y.o. male with a diagnosis of biochemically recurrent prostate cancer. His prostate cancer was originally diagnosed in November 2013 as Gleason 3+4 with pre-treatment PSA of 3.7. He underwent radical prostatectomy and bilateral lymphadenectomy with Dr. Risa Grill on 05/27/2012. Final pathology revealed pT2b, Gleason 3+3 prostatic adenocarcinoma involving the left lobe. Apical and bladder neck margins were negative. Seminal vesicles were negative.  No extracapsular prostatic extension was identified. Perineural invasion and lymphovascular invasion were identified. One left pelvic lymph node was positive for metastatic disease (pN1). PSA 5 weeks out from surgery was 0.04. Tumor board discussed whole pelvic radiation, but no strong consensus was reached. Additional PSA's were stable around 0.04 until January 2016, it increased to 0.07 and has continued to rise since then:  PSA History: 05/2018: PSA 0.36 11/2017: PSA 0.24 04/2017: PSA 0.14 10/2016: PSA 0.14 04/2016: PSA 0.11 10/2015: PSA 0.08 04/2015: PSA 0.07 10/2014: PSA 0.05 07/2014: PSA 0.04 04/2014: PSA 0.07  The patient reviewed the PSA results with his urologist Dr. Louis Meckel, and he has kindly been referred today for discussion of potential radiation treatment options. The patient denies having undergone any imaging.    PREVIOUS  RADIATION THERAPY: No  PAST MEDICAL HISTORY:  Past Medical History:  Diagnosis Date   Dyslipidemia    Fatty liver    Fibromyalgia    GERD (gastroesophageal reflux disease)    Glaucoma    RIGHT EYE   Glucose intolerance (impaired glucose tolerance)    Hypertension    Pneumonia    4 YRS AGO   Prostate cancer ([place])    Vertigo    ESPECIALLY IF PT TURNS HIS HEAD OR MOVES A CERTAIN WAY      PAST SURGICAL HISTORY: Past Surgical History:  Procedure Laterality Date   CHOLECYSTECTOMY     LEFT KNEE ARTHROSCOPY     LYMPHADENECTOMY  05/27/2012   Procedure: LYMPHADENECTOMY;  Surgeon: Bernestine Amass, MD;  Location: [facility];  Service: Urology;  Laterality: Bilateral;   ROBOT ASSISTED LAPAROSCOPIC RADICAL PROSTATECTOMY  05/27/2012   Procedure: ROBOTIC ASSISTED LAPAROSCOPIC RADICAL PROSTATECTOMY;  Surgeon: Bernestine Amass, MD;  Location: [facility];  Service: Urology;  Laterality: N/A;   SURGERY FOR DEVIATED NASAL SEPTUM      FAMILY HISTORY:  Family History  Problem Relation Age of Onset   Breast cancer Mother    Bladder Cancer Father    Throat cancer Father    Lung cancer Paternal Aunt        non smoker    SOCIAL HISTORY:  Social History   Socioeconomic History   Marital status: Married    Spouse name: Not on file   Number of children: 2   Years of education: Not on file   Highest education level: Not on file  Occupational History    Comment: retired since 2015  Social Needs   Financial resource strain: Not  on file   Food insecurity:    Worry: Not on file    Inability: Not on file   Transportation needs:    Medical: Not on file    Non-medical: Not on file  Tobacco Use   Smoking status: Former Smoker    Packs/day: 2.00    Years: 15.00    Pack years: 30.00    Types: Cigarettes    Last attempt to quit: 04/22/1978    Years since quitting: 40.3   Smokeless tobacco: Never Used   Tobacco comment: [facility] [facility]  Substance and Sexual Activity    Alcohol use: Yes    Comment: 1 0R 2 BEERS DAILY   Drug use: No   Sexual activity: Yes    Comment: with aid of Viagra  Lifestyle   Physical activity:    Days per week: Not on file    Minutes per session: Not on file   Stress: Not on file  Relationships   Social connections:    Talks on phone: Not on file    Gets together: Not on file    Attends religious service: Not on file    Active member of club or organization: Not on file    Attends meetings of clubs or organizations: Not on file    Relationship status: Not on file   Intimate partner violence:    Fear of current or ex partner: Not on file    Emotionally abused: Not on file    Physically abused: Not on file    Forced sexual activity: Not on file  Other Topics Concern   Not on file  Social History Narrative   Not on file    ALLERGIES: Patient has no known allergies.  MEDICATIONS:  Current Outpatient Medications  Medication Sig Dispense Refill   AMLODIPINE BENZOATE PO Take 2.5 mg by mouth daily.     aspirin 81 MG tablet Take 81 mg by mouth.     enalapril (VASOTEC) 5 MG tablet Take 5 mg by mouth every morning.     imipramine (TOFRANIL) 10 MG tablet Take 10 mg by mouth at bedtime.     losartan (COZAAR) 100 MG tablet Take 100 mg by mouth daily.     Multiple Vitamins-Minerals (MULTIVITAMIN ADULT PO) Take by mouth.     Pomegranate, [place] gran[place], (POMEGRANATE EXTRACT PO) Take by mouth.     rosuvastatin (CRESTOR) 5 MG tablet Take 5 mg by mouth daily.     sildenafil (VIAGRA) 100 MG tablet Take 100 mg by mouth daily as needed for erectile dysfunction.     timolol (TIMOPTIC) 0.5 % ophthalmic solution Place 1 drop into the right eye 2 (two) times daily.     KRILL OIL PO Take by mouth.     omeprazole (PRILOSEC) 20 MG capsule Take 20 mg by mouth daily as needed. For heartburn     No current facility-administered medications for this encounter.     REVIEW OF SYSTEMS:  On review of systems, the patient  reports that he is doing well overall. He denies any chest pain, shortness of breath, cough, fevers, chills, night sweats, or unintended weight changes. He denies any bowel disturbances, and denies abdominal pain, nausea or vomiting. He denies any new musculoskeletal or joint aches or pains. His IPSS was 7, indicating mild urinary symptoms with minimal leakage, weak stream, and nocturia. He denies incontinence, dysuria, or hematuria. His SHIM was 18, indicating he does have mild erectile dysfunction. He reports he is sexually active with  aid of Viagra. A complete review of systems is obtained and is otherwise negative.    PHYSICAL EXAM:  Wt Readings from Last 3 Encounters:  08/11/18 202 lb (91.6 kg)  05/15/17 197 lb 3.2 oz (89.4 kg)  05/27/12 197 lb (89.4 kg)   Temp Readings from Last 3 Encounters:  05/28/12 97.3 F (36.3 C) (Oral)  05/18/12 98.5 F (36.9 C) (Oral)   BP Readings from Last 3 Encounters:  05/28/12 127/82  05/18/12 120/81   Pulse Readings from Last 3 Encounters:  05/28/12 79  05/18/12 82   Pain Assessment Pain Score: 0-No pain/10  In general this is a well appearing caucasian male in no acute distress. He is alert and oriented x4. Remainder of exam deferred in light of virtual consultation platform.    KPS = 100  100 - Normal; no complaints; no evidence of disease. 90   - Able to carry on normal activity; minor signs or symptoms of disease. 80   - Normal activity with effort; some signs or symptoms of disease. 40   - Cares for self; unable to carry on normal activity or to do active work. 60   - Requires occasional assistance, but is able to care for most of his personal needs. 50   - Requires considerable assistance and frequent medical care. 30   - Disabled; requires special care and assistance. 31   - Severely disabled; hospital admission is indicated although death not imminent. 17   - Very sick; hospital admission necessary; active supportive treatment  necessary. 10   - Moribund; fatal processes progressing rapidly. 0     - Dead  Karnofsky DA, Abelmann Pitkin, Craver LS and Burchenal [facility] [phone]) The use of the nitrogen mustards in the palliative treatment of carcinoma: with particular reference to bronchogenic carcinoma Cancer 1 634-56  LABORATORY DATA:  Lab Results  Component Value Date   WBC 6.6 05/18/2012   HGB 13.7 05/28/2012   HCT 38.6 (L) 05/28/2012   MCV 87.5 05/18/2012   PLT 261 05/18/2012   Lab Results  Component Value Date   NA 137 05/28/2012   K 3.8 05/28/2012   CL 103 05/28/2012   CO2 26 05/28/2012   No results found for: ALT, AST, GGT, ALKPHOS, BILITOT   RADIOGRAPHY: No results found.    IMPRESSION/PLAN: 1. 63 y.o. gentleman with a biochemical recurrence of pT2b N1 MX Gleason 3+3 adenocarcinoma of the prostate with current PSA of 0.36.  Today we reviewed the findings and workup thus far.  We discussed the natural history of biochemically recurrent prostate cancer.  We reviewed the implications of positive lymph nodes and rising, detectable postoperative PSA on the risk of prostate cancer recurrence. We discussed radiation treatment directed to the prostatic fossa and pelvic lymph nodes with regard to the logistics and delivery of external beam radiation treatment. We also discussed use of ADT in combination with salvage XRT to the prostate fossa and pelvic lymph nodes in the setting of biochemical recurrence.  It is felt that the toxicities associated with ADT and the negative impact it has on quality of life for patients with lower risk, Gleason 6 disease with positive lymph node involvement and several years out from surgery, may outweigh the overall benefits of the treatment. Therefore, we recommend proceeding with Axumin PET scan for restaging prior to making final treatment recommendations. Once we have those results, we will follow up with the patient to discuss and make a final decision on proceeding with salvage XRT +/-  ST-ADT treatment. The patient was encouraged to ask questions that were answered to his stated satisfaction and advised to call with any additional questions or concerns in the interim.  We will share this discussion with Dr. Louis Meckel and keep him informed as we move forward.  This encounter was provided by telemedicine platform Webex.  The patient has given verbal consent for this type of encounter and has been advised to only accept a meeting of this type in a secure network environment. The time spent during this encounter was 60 minutes. The attendants for this meeting include Tyler Pita MD, [address] PA-C, scribe Clinton Sawyer, and patient, DINH AYOTTE.  During the encounter, Tyler Pita MD, Ashlyn Bruning PA-C, and scribe Clinton Sawyer were located at [facility] Radiation Oncology Department.  Patient Jonathan Washington was located at home.     Nicholos Johns, PA-C    Tyler Pita, MD  [facility] Direct Dial: [phone]   Fax: [phone] [email or web address]   Skype   [email or web address]  This document serves as a record of services personally performed by Tyler Pita, MD and Freeman Caldron, PA-C. It was created on their behalf by Rae Lips, a trained medical scribe. The creation of this record is based on the scribe's personal observations and the providers' statements to them. This document has been checked and approved by the attending providers.

## 2018-08-11 NOTE — Progress Notes (Signed)
See progress note under physician encounter. 

## 2018-08-12 ENCOUNTER — Telehealth: Payer: Self-pay | Admitting: *Deleted

## 2018-08-12 NOTE — Telephone Encounter (Signed)
CALLED PATIENT TO INFORM OF PET SCAN FOR 08-18-18 - ARRIVAL TIME - 1:30 PM @ [facility], PT. TO NOT HAVE ANYTHING BY MOUTH - 6 HRS. PRIOR TO TEST, PATIENT CANNOT SUCK ANY HARD CANDY OR CHEW ANY GUM, SPOKE WITH PATIENT AND HE IS AWARE OF THIS TEST

## 2018-08-18 ENCOUNTER — Ambulatory Visit (HOSPITAL_COMMUNITY): Payer: Medicare Other

## 2018-08-18 ENCOUNTER — Encounter: Payer: Self-pay | Admitting: Urology

## 2018-08-18 ENCOUNTER — Telehealth: Payer: Self-pay | Admitting: *Deleted

## 2018-08-18 ENCOUNTER — Telehealth: Payer: Self-pay | Admitting: Medical Oncology

## 2018-08-18 NOTE — Progress Notes (Signed)
Axumin PET rescheduled to 08/25/18 at 4pm.

## 2018-08-18 NOTE — Telephone Encounter (Signed)
Called to introduce myself  as the prostate nurse navigator and my role. I was unable to meet him 4/21 during consult with Dr. Tammi Klippel due to consult being conducted via WebEx due to current COVID-19 concerns for limiting patient exposure. He is post prostatectomy 2014 with negative margins and positive lymph node, now with rising PSA. He is scheduled for PET scan 5/5 and follow up with Ashlyn 5/12. I informed him I am working remotely and asked him to leave questions or concerns on my office voice mail and I will return his call.

## 2018-08-18 NOTE — Telephone Encounter (Signed)
Called patient to inform to [facility] appt. For 09-01-18 - 1 pm  For 1:30 pm appt. With [employer], spoke with patient and he is aware of this appt.

## 2018-08-18 NOTE — Telephone Encounter (Signed)
Called patient to inform of Pet Scan for 08-25-18 - arrival time - 3:30 pm @ [facility], patient to be NPO- 6 hrs. prior to test and patient should not exercise - 24 hrs. prior to test, lvm for a return call

## 2018-08-21 ENCOUNTER — Institutional Professional Consult (permissible substitution): Payer: Federal, State, Local not specified - PPO | Admitting: Radiation Oncology

## 2018-08-25 ENCOUNTER — Other Ambulatory Visit: Payer: Self-pay

## 2018-08-25 ENCOUNTER — Encounter (HOSPITAL_COMMUNITY)
Admission: RE | Admit: 2018-08-25 | Discharge: 2018-08-25 | Disposition: A | Discharge status: Home / Self Care | Payer: Medicare Other | Source: Ambulatory Visit | Attending: Urology | Admitting: Urology

## 2018-08-25 DIAGNOSIS — C61 Malignant neoplasm of prostate: Secondary | ICD-10-CM | POA: Diagnosis not present

## 2018-08-25 MED ORDER — AXUMIN (FLUCICLOVINE F 18) INJECTION
10.2000 | Freq: Once | INTRAVENOUS | Status: AC
  Administered 2018-08-25: 10.2 via INTRAVENOUS

## 2018-09-01 ENCOUNTER — Other Ambulatory Visit: Payer: Self-pay

## 2018-09-01 ENCOUNTER — Ambulatory Visit
Admission: RE | Admit: 2018-09-01 | Discharge: 2018-09-01 | Disposition: A | Discharge status: Home / Self Care | Payer: Medicare Other | Source: Ambulatory Visit | Attending: Urology | Admitting: Urology

## 2018-09-01 ENCOUNTER — Encounter: Payer: Self-pay | Admitting: Urology

## 2018-09-01 VITALS — Ht 71.0 in | Wt 202.0 lb

## 2018-09-01 DIAGNOSIS — C61 Malignant neoplasm of prostate: Secondary | ICD-10-CM

## 2018-09-01 NOTE — Progress Notes (Signed)
Follow up new consult today with Jonathan Bruning, Jonathan Washington to review PET scan results which were negative for metastatic disease.   GU Location of Tumor / Histology: Biochemical recurrent prostate cancer  If Prostate Cancer, Gleason Score is (3 + 3) and PSA is (3.7)pre treatment.   06/23/2018          PSA                 0.36  Jonathan Washington was diagnosed with prostate cancer in 2014. He had a prostatectomy the same year. Unfortunately, a positive node was found.       Past/Anticipated interventions by urology, if any: prostatectomy (05/2012), active surveillance, denies that any imaging has ever been done, referral for consideration of radiotherapy  Past/Anticipated interventions by medical oncology, if any: no  Weight changes, if any: no  Bowel/Bladder complaints, if any: IPSS 7. SHIM 18. Sexually active with aid of Viagra. Denies urinary incontinence. Reports minimal leakage. Denies dysuria or hematuria. Reports a weak urine stream and nocturia.    Nausea/Vomiting, if any: no  Pain issues, if any:  no  SAFETY ISSUES:  Prior radiation? no  Pacemaker/ICD? no  Possible current pregnancy? no, male patient  Is the patient on methotrexate? no  Current Complaints / other details:  63 year old male. NKDA. Mother and father both with hx of ca.

## 2018-09-01 NOTE — Progress Notes (Signed)
See progress note under physician encounter. 

## 2018-09-01 NOTE — Progress Notes (Signed)
Radiation Oncology         (336) [phone] ________________________________  Name: Jonathan Washington MRN: [id number]  Date: 09/01/2018  DOB: 02/15/1956  Follow-Up New Visit Note - Conducted via WebEx due to current COVID-19 concerns for limiting patient exposure  CC: Hulan Fess, MD  Ardis Hughs, MD  Diagnosis:   63 y.o. gentleman with a biochemical recurrence of pT2b N1 MX Gleason 3+3 adenocarcinoma of the prostate with current PSA of 0.36    ICD-10-CM   1. Malignant neoplasm of prostate ([place]) C61    Narrative:  The patient returns today for re-evaluation of his prostate cancer following Axumin PET scan performed 08/25/2018.  This scan showed no evidence of local recurrence in the prostatectomy bed, metastatic lymphadenopathy, soft tissue or skeletal metastasis.       In summary,  his prostate cancer was originally diagnosed in November 2013 as Gleason 3+4 with pre-treatment PSA of 3.7. He underwent radical prostatectomy and bilateral lymphadenectomy with Dr. Risa Grill on 05/27/2012. Final pathology revealed pT2b, Gleason 3+3 prostatic adenocarcinoma involving the left lobe with ECE, PNI, LVI and one left pelvic lymph node positive for metastatic disease (pN1). Surgical margins were negative and there was no seminal vesicle involvement. His initial post-operative PSA was 0.04. His case was presented at the multidisciplinary prostate cancer tumor board regarding adjuvant prostate fossa radiation, but no strong consensus was reached. After further discussion with his urologist, the decision was to proceed with close monitoring of the PSA.  His subsequent PSA values remained stable around 0.04 until January 2016, when it increased to 0.07 and has continued to gradually rise since that time as below:  PSA History: 05/2018:          PSA 0.36 11/2017:          PSA 0.24 04/2017:          PSA 0.14 10/2016:          PSA 0.14 04/2016:          PSA 0.11 10/2015:          PSA 0.08 04/2015:           PSA 0.07 10/2014:          PSA 0.05 07/2014:          PSA 0.04 04/2014:          PSA 0.07     The patient returns today for further discussion of his recent [facility] [place] PET and final treatment reccommendations.       On review of systems, the patient reports that he is doing well overall. His IPSS was 7, indicating mild urinary symptoms with minimal leakage, weak stream, and nocturia. He denies incontinence, dysuria, or hematuria. His SHIM was 18, indicating he does have mild erectile dysfunction. He is sexually active with aid of Viagra.   ALLERGIES:  has No Known Allergies.  Meds: Current Outpatient Medications  Medication Sig Dispense Refill  . amLODipine (NORVASC) 2.5 MG tablet TAKE 1 TABLET BY MOUTH ONCE DAILY FOR 30 DAYS    . aspirin 81 MG tablet Take 81 mg by mouth.    . enalapril (VASOTEC) 5 MG tablet Take 5 mg by mouth every morning.    Marland Kitchen imipramine (TOFRANIL) 10 MG tablet Take 10 mg by mouth at bedtime.    Marland Kitchen KRILL OIL PO Take by mouth.    . losartan (COZAAR) 100 MG tablet Take 100 mg by mouth daily.    . Multiple Vitamins-Minerals (MULTIVITAMIN ADULT PO)  Take by mouth.    Marland Kitchen omeprazole (PRILOSEC) 20 MG capsule Take 20 mg by mouth daily as needed. For heartburn    . Pomegranate, Punica granatum, (POMEGRANATE EXTRACT PO) Take by mouth.    . rosuvastatin (CRESTOR) 5 MG tablet Take 5 mg by mouth daily.    . sildenafil (VIAGRA) 100 MG tablet Take 100 mg by mouth daily as needed for erectile dysfunction.    . timolol (TIMOPTIC) 0.5 % ophthalmic solution Place 1 drop into the right eye 2 (two) times daily.     No current facility-administered medications for this encounter.     Physical Findings: In general this is a well appearing caucasian male in no acute distress. He's alert and oriented x4 and appropriate throughout the examination. Cardiopulmonary assessment is negative for acute distress and he exhibits normal effort.  Remainder of exam not performed in light of virtual WebEX  visit platform.  vitals were not taken for this visit.   Lab Findings: Lab Results  Component Value Date   WBC 6.6 05/18/2012   HGB 13.7 05/28/2012   HCT 38.6 (L) 05/28/2012   PLT 261 05/18/2012    Lab Results  Component Value Date   NA 137 05/28/2012   K 3.8 05/28/2012   CO2 26 05/28/2012   GLUCOSE 153 (H) 05/28/2012   BUN 10 05/28/2012   CREATININE 0.84 05/28/2012   CALCIUM 8.3 (L) 05/28/2012    Radiographic Findings: Nm Pet (axumin) Skull Base To Mid Thigh  Result Date: 08/26/2018 CLINICAL DATA:  Prostate carcinoma with biochemical recurrence. T2 N1. Gleason 3 + 3 adenocarcinoma. Radical prostatectomy. EXAM: NUCLEAR MEDICINE PET SKULL BASE TO THIGH TECHNIQUE: 10.2 mCi F-18 Fluciclovine was injected intravenously. Full-ring PET imaging was performed from the skull base to thigh after the radiotracer. CT data was obtained and used for attenuation correction and anatomic localization. COMPARISON:  None. FINDINGS: NECK No radiotracer activity in neck lymph nodes. Incidental CT finding: None CHEST No radiotracer accumulation within mediastinal or hilar lymph nodes. No suspicious pulmonary nodules on the CT scan. Incidental CT finding: None ABDOMEN/PELVIS Prostate: No focal activity prostatectomy bed. Lymph nodes: No abnormal radiotracer accumulation within pelvic or abdominal nodes. Liver: No evidence of liver metastasis Incidental CT finding: Mild hepatic steatosis.  Postcholecystectomy SKELETON No focal  activity to suggest skeletal metastasis. IMPRESSION: 1. No evidence of local recurrence in the prostatectomy bed. 2. No evidence of metastatic lymphadenopathy. 3. No soft tissue metastasis or skeletal metastasis Electronically Signed   By: Suzy Bouchard M.D.   On: 08/26/2018 08:59    Impression:  63 y.o. gentleman with a biochemical recurrence of pT2b N1 MX Gleason 3+3 adenocarcinoma of the prostate with current PSA of 0.36.  This visit was conducted via WebEx to spare the patient  unnecessary potential exposure in the healthcare setting during the current COVID-19 pandemic.  Today we reviewed the findings and workup thus far. The patient's recent [place] PET was personally reviewed by Dr. Tammi Klippel who participated in formalizing a treatment plan.  We reviewed the natural history of biochemically recurrent prostate cancer as well as the implications of positive lymph nodes and rising, detectable postoperative PSA on the risk of prostate cancer recurrence. We discussed radiation treatment directed to the prostatic fossa and pelvic lymph nodes with regard to the logistics and delivery of external beam radiation treatment. We also revisited the topic of ADT use in combination with salvage XRT to the prostate fossa and pelvic lymph nodes in the setting of biochemical recurrence.After thorough  discussion, it is felt that the toxicities associated with ADT and the negative impact it has on quality of life for patients with lowerrisk,Gleason 6 diseasewith a single positive lymph node and very slow, gradual rise in PSA several years out from surgery,may outweigh the overall benefits of the treatment. Additionally, given the lack of measurable disease on recent Axumin PET scan, the patient prefers to defer the use of ADT for now and reserve for use only if the PSA continues to rise despite salvage radiotherapy.  However, he states that he would be open to further conversation with Dr. Louis Meckel if he feels strongly that ADT is indicated.  Plan:  The patient elects to proceed with 7.5 weeks of salvage external beam radiation therapy directed to the prostatic fossa and pelvic lymph nodes without addition of ADT. I will share this discussion with Dr. Louis Meckel and move forward with treatment planning in anticipation of beginning radiation in the near future. The patient would also like to have a repeat PSA to obtain a baseline before beginning treatment since it has now been 4 months since his last  PSA evaluation.  This encounter was provided by telemedicine platform Webex.  The patient has given verbal consent for this type of encounter and has been advised to only accept a meeting of this type in a secure network environment. The time spent during this encounter was 30 minutes. The attendants for this meeting include [employer], scribe Clinton Sawyer, and patient, Jonathan Washington.  During the encounter, Otha Monical PA-C and scribe Clinton Sawyer were located at [facility] Radiation Oncology Department.  Patient, Jonathan Washington was located at home.    Nicholos Johns, MMS, PA-C [place] at [place]: [phone]  Fax: [phone]  This document serves as a record of services personally performed by Ireanna Finlayson, PA-C. It was created on her behalf by Rae Lips, a trained medical scribe. The creation of this record is based on the scribe's personal observations and the provider's statements to them. This document has been checked and approved by the attending provider.

## 2018-09-02 ENCOUNTER — Telehealth: Payer: Self-pay | Admitting: Urology

## 2018-09-02 ENCOUNTER — Telehealth: Payer: Self-pay | Admitting: *Deleted

## 2018-09-02 NOTE — Telephone Encounter (Signed)
I spoke with the patient's wife to inform that I had communicated with Dr. Louis Meckel who agrees with proceeding with salvage XRT to prostate fossa and nodes without ADT, reserving ADT for use if the PSA continues to rise despite XRT.  Dr. Carlton Adam nurse will contact them to schedule a lab only visit within the next week to establish a baseline prior to starting treatment.  He will have CT SIM on 09/11/18 at 9am for treatment planning in anticipation of beginning treatment 09/21/18.  She is comfortable with and in agreement with the stated pan and will share this information with her husband as soon as he returns. They are advised to call with any additional questions or concerns in the interim.  Nicholos Johns, MMS, PA-C [place] at [place]: [phone]  Fax: [phone]

## 2018-09-02 NOTE — Telephone Encounter (Signed)
Called patient to inform of lab appt. for repeat PSA on Tuesday May 19- arrival time - 10:45 am @ Dr. Carlton Adam Office, lvm for a return call

## 2018-09-10 ENCOUNTER — Telehealth: Payer: Self-pay | Admitting: Medical Oncology

## 2018-09-10 NOTE — Progress Notes (Signed)
  Radiation Oncology         [phone]) [phone] ________________________________  Name: Jonathan Washington MRN: [id number]  Date: 09/11/2018  DOB: 1956-04-15  SIMULATION AND TREATMENT PLANNING NOTE    ICD-10-CM   1. Malignant neoplasm of prostate ([place]) C61     DIAGNOSIS:  63 y.o. gentleman with a biochemical recurrence of pT2b N1 MX Gleason 3+3 adenocarcinoma of the prostate with current PSA of 0.36  NARRATIVE:  The patient was brought to the [facility].  Identity was confirmed.  All relevant records and images related to the planned course of therapy were reviewed.  The patient freely provided informed written consent to proceed with treatment after reviewing the details related to the planned course of therapy. The consent form was witnessed and verified by the simulation staff.  Then, the patient was set-up in a stable reproducible supine position for radiation therapy.  A vacuum lock pillow device was custom fabricated to position his legs in a reproducible immobilized position.  Then, I performed a urethrogram under sterile conditions to identify the prostatic bed.  CT images were obtained.  Surface markings were placed.  The CT images were loaded into the planning software.  Then the prostate bed target, pelvic lymph node target and avoidance structures including the rectum, bladder, bowel and hips were contoured.  Treatment planning then occurred.  The radiation prescription was entered and confirmed.  A total of one complex treatment devices were fabricated. I have requested : Intensity Modulated Radiotherapy (IMRT) is medically necessary for this case for the following reason:  Rectal sparing.Marland Kitchen  PLAN:  The patient will receive 45 Gy in 25 fractions of 1.8 Gy, followed by a boost to the prostate bed to a total dose of 68.4 Gy with 13 additional fractions of 1.8 Gy.   ________________________________  Sheral Apley Tammi Klippel, M.D.

## 2018-09-10 NOTE — Telephone Encounter (Signed)
Patient called stating he has an appointment for CT simulation at 9:00 am in the am. His My Chart keeps telling him the appointment has been changed but the  date and time is the same. I confirmed his simulation is 5/22 at 9 am. He voiced understanding.

## 2018-09-11 ENCOUNTER — Encounter: Payer: Self-pay | Admitting: Medical Oncology

## 2018-09-11 ENCOUNTER — Other Ambulatory Visit: Payer: Self-pay

## 2018-09-11 ENCOUNTER — Encounter: Payer: Self-pay | Admitting: Urology

## 2018-09-11 ENCOUNTER — Ambulatory Visit
Admission: RE | Admit: 2018-09-11 | Discharge: 2018-09-11 | Disposition: A | Discharge status: Home / Self Care | Payer: Medicare Other | Source: Ambulatory Visit | Attending: Radiation Oncology | Admitting: Radiation Oncology

## 2018-09-11 DIAGNOSIS — C61 Malignant neoplasm of prostate: Secondary | ICD-10-CM

## 2018-09-11 DIAGNOSIS — Z510 Encounter for antineoplastic radiation therapy: Secondary | ICD-10-CM | POA: Insufficient documentation

## 2018-09-11 NOTE — Progress Notes (Signed)
Repeat PSA on 09/08/2018 was further elevated at 0.48, previously 0.36 in February 2020.   He had CT simulation/treatment planning earlier today and is scheduled to begin his 7-1/2-week course of salvage radiotherapy to the prostate fossa on 09/23/2018.  Nicholos Johns, MMS, PA-C [place] at [place]: [phone]  Fax: [phone]

## 2018-09-17 DIAGNOSIS — Z510 Encounter for antineoplastic radiation therapy: Secondary | ICD-10-CM | POA: Diagnosis not present

## 2018-09-23 ENCOUNTER — Other Ambulatory Visit: Payer: Self-pay

## 2018-09-23 ENCOUNTER — Ambulatory Visit
Admission: RE | Admit: 2018-09-23 | Discharge: 2018-09-23 | Disposition: A | Discharge status: Home / Self Care | Payer: Medicare Other | Source: Ambulatory Visit | Attending: Radiation Oncology | Admitting: Radiation Oncology

## 2018-09-23 DIAGNOSIS — C61 Malignant neoplasm of prostate: Secondary | ICD-10-CM | POA: Insufficient documentation

## 2018-09-23 DIAGNOSIS — Z510 Encounter for antineoplastic radiation therapy: Secondary | ICD-10-CM | POA: Diagnosis present

## 2018-09-24 ENCOUNTER — Other Ambulatory Visit: Payer: Self-pay

## 2018-09-24 ENCOUNTER — Ambulatory Visit
Admission: RE | Admit: 2018-09-24 | Discharge: 2018-09-24 | Disposition: A | Discharge status: Home / Self Care | Payer: Medicare Other | Source: Ambulatory Visit | Attending: Radiation Oncology | Admitting: Radiation Oncology

## 2018-09-24 DIAGNOSIS — Z510 Encounter for antineoplastic radiation therapy: Secondary | ICD-10-CM | POA: Diagnosis not present

## 2018-09-25 ENCOUNTER — Other Ambulatory Visit: Payer: Self-pay

## 2018-09-25 ENCOUNTER — Ambulatory Visit
Admission: RE | Admit: 2018-09-25 | Discharge: 2018-09-25 | Disposition: A | Discharge status: Home / Self Care | Payer: Medicare Other | Source: Ambulatory Visit | Attending: Radiation Oncology | Admitting: Radiation Oncology

## 2018-09-25 DIAGNOSIS — Z510 Encounter for antineoplastic radiation therapy: Secondary | ICD-10-CM | POA: Diagnosis not present

## 2018-09-28 ENCOUNTER — Other Ambulatory Visit: Payer: Self-pay

## 2018-09-28 ENCOUNTER — Ambulatory Visit
Admission: RE | Admit: 2018-09-28 | Discharge: 2018-09-28 | Disposition: A | Discharge status: Home / Self Care | Payer: Medicare Other | Source: Ambulatory Visit | Attending: Radiation Oncology | Admitting: Radiation Oncology

## 2018-09-28 DIAGNOSIS — Z510 Encounter for antineoplastic radiation therapy: Secondary | ICD-10-CM | POA: Diagnosis not present

## 2018-09-29 ENCOUNTER — Other Ambulatory Visit: Payer: Self-pay

## 2018-09-29 ENCOUNTER — Ambulatory Visit
Admission: RE | Admit: 2018-09-29 | Discharge: 2018-09-29 | Disposition: A | Discharge status: Home / Self Care | Payer: Medicare Other | Source: Ambulatory Visit | Attending: Radiation Oncology | Admitting: Radiation Oncology

## 2018-09-29 DIAGNOSIS — Z510 Encounter for antineoplastic radiation therapy: Secondary | ICD-10-CM | POA: Diagnosis not present

## 2018-09-30 ENCOUNTER — Ambulatory Visit
Admission: RE | Admit: 2018-09-30 | Discharge: 2018-09-30 | Disposition: A | Discharge status: Home / Self Care | Payer: Medicare Other | Source: Ambulatory Visit | Attending: Radiation Oncology | Admitting: Radiation Oncology

## 2018-09-30 ENCOUNTER — Other Ambulatory Visit: Payer: Self-pay

## 2018-09-30 DIAGNOSIS — Z510 Encounter for antineoplastic radiation therapy: Secondary | ICD-10-CM | POA: Diagnosis not present

## 2018-10-01 ENCOUNTER — Ambulatory Visit
Admission: RE | Admit: 2018-10-01 | Discharge: 2018-10-01 | Disposition: A | Discharge status: Home / Self Care | Payer: Medicare Other | Source: Ambulatory Visit | Attending: Radiation Oncology | Admitting: Radiation Oncology

## 2018-10-01 ENCOUNTER — Other Ambulatory Visit: Payer: Self-pay

## 2018-10-01 DIAGNOSIS — Z510 Encounter for antineoplastic radiation therapy: Secondary | ICD-10-CM | POA: Diagnosis not present

## 2018-10-02 ENCOUNTER — Other Ambulatory Visit: Payer: Self-pay

## 2018-10-02 ENCOUNTER — Ambulatory Visit
Admission: RE | Admit: 2018-10-02 | Discharge: 2018-10-02 | Disposition: A | Discharge status: Home / Self Care | Payer: Medicare Other | Source: Ambulatory Visit | Attending: Radiation Oncology | Admitting: Radiation Oncology

## 2018-10-02 DIAGNOSIS — Z510 Encounter for antineoplastic radiation therapy: Secondary | ICD-10-CM | POA: Diagnosis not present

## 2018-10-05 ENCOUNTER — Ambulatory Visit
Admission: RE | Admit: 2018-10-05 | Discharge: 2018-10-05 | Disposition: A | Discharge status: Home / Self Care | Payer: Medicare Other | Source: Ambulatory Visit | Attending: Radiation Oncology | Admitting: Radiation Oncology

## 2018-10-05 ENCOUNTER — Other Ambulatory Visit: Payer: Self-pay

## 2018-10-05 DIAGNOSIS — Z510 Encounter for antineoplastic radiation therapy: Secondary | ICD-10-CM | POA: Diagnosis not present

## 2018-10-06 ENCOUNTER — Ambulatory Visit
Admission: RE | Admit: 2018-10-06 | Discharge: 2018-10-06 | Disposition: A | Discharge status: Home / Self Care | Payer: Medicare Other | Source: Ambulatory Visit | Attending: Radiation Oncology | Admitting: Radiation Oncology

## 2018-10-06 ENCOUNTER — Other Ambulatory Visit: Payer: Self-pay

## 2018-10-06 DIAGNOSIS — Z510 Encounter for antineoplastic radiation therapy: Secondary | ICD-10-CM | POA: Diagnosis not present

## 2018-10-07 ENCOUNTER — Ambulatory Visit
Admission: RE | Admit: 2018-10-07 | Discharge: 2018-10-07 | Disposition: A | Discharge status: Home / Self Care | Payer: Medicare Other | Source: Ambulatory Visit | Attending: Radiation Oncology | Admitting: Radiation Oncology

## 2018-10-07 ENCOUNTER — Other Ambulatory Visit: Payer: Self-pay

## 2018-10-07 DIAGNOSIS — Z510 Encounter for antineoplastic radiation therapy: Secondary | ICD-10-CM | POA: Diagnosis not present

## 2018-10-08 ENCOUNTER — Other Ambulatory Visit: Payer: Self-pay

## 2018-10-08 ENCOUNTER — Ambulatory Visit
Admission: RE | Admit: 2018-10-08 | Discharge: 2018-10-08 | Disposition: A | Discharge status: Home / Self Care | Payer: Medicare Other | Source: Ambulatory Visit | Attending: Radiation Oncology | Admitting: Radiation Oncology

## 2018-10-08 DIAGNOSIS — Z510 Encounter for antineoplastic radiation therapy: Secondary | ICD-10-CM | POA: Diagnosis not present

## 2018-10-09 ENCOUNTER — Ambulatory Visit
Admission: RE | Admit: 2018-10-09 | Discharge: 2018-10-09 | Disposition: A | Discharge status: Home / Self Care | Payer: Medicare Other | Source: Ambulatory Visit | Attending: Radiation Oncology | Admitting: Radiation Oncology

## 2018-10-09 ENCOUNTER — Other Ambulatory Visit: Payer: Self-pay | Admitting: Radiation Oncology

## 2018-10-09 ENCOUNTER — Other Ambulatory Visit: Payer: Self-pay

## 2018-10-09 DIAGNOSIS — Z510 Encounter for antineoplastic radiation therapy: Secondary | ICD-10-CM | POA: Diagnosis not present

## 2018-10-09 MED ORDER — PROCHLORPERAZINE MALEATE 10 MG PO TABS: 10.0000 mg | ORAL_TABLET | Freq: Four times a day (QID) | ORAL | 0 refills | Status: DC | PRN

## 2018-10-12 ENCOUNTER — Ambulatory Visit
Admission: RE | Admit: 2018-10-12 | Discharge: 2018-10-12 | Disposition: A | Discharge status: Home / Self Care | Payer: Medicare Other | Source: Ambulatory Visit | Attending: Radiation Oncology | Admitting: Radiation Oncology

## 2018-10-12 ENCOUNTER — Other Ambulatory Visit: Payer: Self-pay

## 2018-10-12 DIAGNOSIS — Z510 Encounter for antineoplastic radiation therapy: Secondary | ICD-10-CM | POA: Diagnosis not present

## 2018-10-13 ENCOUNTER — Ambulatory Visit
Admission: RE | Admit: 2018-10-13 | Discharge: 2018-10-13 | Disposition: A | Discharge status: Home / Self Care | Payer: Medicare Other | Source: Ambulatory Visit | Attending: Radiation Oncology | Admitting: Radiation Oncology

## 2018-10-13 ENCOUNTER — Other Ambulatory Visit: Payer: Self-pay

## 2018-10-13 DIAGNOSIS — Z510 Encounter for antineoplastic radiation therapy: Secondary | ICD-10-CM | POA: Diagnosis not present

## 2018-10-14 ENCOUNTER — Ambulatory Visit
Admission: RE | Admit: 2018-10-14 | Discharge: 2018-10-14 | Disposition: A | Discharge status: Home / Self Care | Payer: Medicare Other | Source: Ambulatory Visit | Attending: Radiation Oncology | Admitting: Radiation Oncology

## 2018-10-14 ENCOUNTER — Other Ambulatory Visit: Payer: Self-pay

## 2018-10-14 DIAGNOSIS — Z510 Encounter for antineoplastic radiation therapy: Secondary | ICD-10-CM | POA: Diagnosis not present

## 2018-10-15 ENCOUNTER — Other Ambulatory Visit: Payer: Self-pay

## 2018-10-15 ENCOUNTER — Ambulatory Visit
Admission: RE | Admit: 2018-10-15 | Discharge: 2018-10-15 | Disposition: A | Discharge status: Home / Self Care | Payer: Medicare Other | Source: Ambulatory Visit | Attending: Radiation Oncology | Admitting: Radiation Oncology

## 2018-10-15 DIAGNOSIS — Z510 Encounter for antineoplastic radiation therapy: Secondary | ICD-10-CM | POA: Diagnosis not present

## 2018-10-16 ENCOUNTER — Other Ambulatory Visit: Payer: Self-pay

## 2018-10-16 ENCOUNTER — Ambulatory Visit
Admission: RE | Admit: 2018-10-16 | Discharge: 2018-10-16 | Disposition: A | Discharge status: Home / Self Care | Payer: Medicare Other | Source: Ambulatory Visit | Attending: Radiation Oncology | Admitting: Radiation Oncology

## 2018-10-16 DIAGNOSIS — Z510 Encounter for antineoplastic radiation therapy: Secondary | ICD-10-CM | POA: Diagnosis not present

## 2018-10-19 ENCOUNTER — Other Ambulatory Visit: Payer: Self-pay

## 2018-10-19 ENCOUNTER — Ambulatory Visit
Admission: RE | Admit: 2018-10-19 | Discharge: 2018-10-19 | Disposition: A | Discharge status: Home / Self Care | Payer: Medicare Other | Source: Ambulatory Visit | Attending: Radiation Oncology | Admitting: Radiation Oncology

## 2018-10-19 DIAGNOSIS — Z510 Encounter for antineoplastic radiation therapy: Secondary | ICD-10-CM | POA: Diagnosis not present

## 2018-10-20 ENCOUNTER — Other Ambulatory Visit: Payer: Self-pay

## 2018-10-20 ENCOUNTER — Ambulatory Visit
Admission: RE | Admit: 2018-10-20 | Discharge: 2018-10-20 | Disposition: A | Discharge status: Home / Self Care | Payer: Medicare Other | Source: Ambulatory Visit | Attending: Radiation Oncology | Admitting: Radiation Oncology

## 2018-10-20 DIAGNOSIS — Z510 Encounter for antineoplastic radiation therapy: Secondary | ICD-10-CM | POA: Diagnosis not present

## 2018-10-21 ENCOUNTER — Ambulatory Visit
Admission: RE | Admit: 2018-10-21 | Discharge: 2018-10-21 | Disposition: A | Discharge status: Home / Self Care | Payer: Medicare Other | Source: Ambulatory Visit | Attending: Radiation Oncology | Admitting: Radiation Oncology

## 2018-10-21 ENCOUNTER — Other Ambulatory Visit: Payer: Self-pay

## 2018-10-21 DIAGNOSIS — C61 Malignant neoplasm of prostate: Secondary | ICD-10-CM | POA: Diagnosis not present

## 2018-10-21 DIAGNOSIS — Z510 Encounter for antineoplastic radiation therapy: Secondary | ICD-10-CM | POA: Insufficient documentation

## 2018-10-22 ENCOUNTER — Ambulatory Visit
Admission: RE | Admit: 2018-10-22 | Discharge: 2018-10-22 | Disposition: A | Discharge status: Home / Self Care | Payer: Medicare Other | Source: Ambulatory Visit | Attending: Radiation Oncology | Admitting: Radiation Oncology

## 2018-10-22 ENCOUNTER — Other Ambulatory Visit: Payer: Self-pay

## 2018-10-22 DIAGNOSIS — Z510 Encounter for antineoplastic radiation therapy: Secondary | ICD-10-CM | POA: Diagnosis not present

## 2018-10-26 ENCOUNTER — Ambulatory Visit
Admission: RE | Admit: 2018-10-26 | Discharge: 2018-10-26 | Disposition: A | Discharge status: Home / Self Care | Payer: Medicare Other | Source: Ambulatory Visit | Attending: Radiation Oncology | Admitting: Radiation Oncology

## 2018-10-26 ENCOUNTER — Other Ambulatory Visit: Payer: Self-pay

## 2018-10-26 DIAGNOSIS — Z510 Encounter for antineoplastic radiation therapy: Secondary | ICD-10-CM | POA: Diagnosis not present

## 2018-10-27 ENCOUNTER — Other Ambulatory Visit: Payer: Self-pay

## 2018-10-27 ENCOUNTER — Ambulatory Visit
Admission: RE | Admit: 2018-10-27 | Discharge: 2018-10-27 | Disposition: A | Discharge status: Home / Self Care | Payer: Medicare Other | Source: Ambulatory Visit | Attending: Radiation Oncology | Admitting: Radiation Oncology

## 2018-10-27 DIAGNOSIS — Z510 Encounter for antineoplastic radiation therapy: Secondary | ICD-10-CM | POA: Diagnosis not present

## 2018-10-28 ENCOUNTER — Other Ambulatory Visit: Payer: Self-pay

## 2018-10-28 ENCOUNTER — Ambulatory Visit
Admission: RE | Admit: 2018-10-28 | Discharge: 2018-10-28 | Disposition: A | Discharge status: Home / Self Care | Payer: Medicare Other | Source: Ambulatory Visit | Attending: Radiation Oncology | Admitting: Radiation Oncology

## 2018-10-28 DIAGNOSIS — Z510 Encounter for antineoplastic radiation therapy: Secondary | ICD-10-CM | POA: Diagnosis not present

## 2018-10-29 ENCOUNTER — Ambulatory Visit
Admission: RE | Admit: 2018-10-29 | Discharge: 2018-10-29 | Disposition: A | Discharge status: Home / Self Care | Payer: Medicare Other | Source: Ambulatory Visit | Attending: Radiation Oncology | Admitting: Radiation Oncology

## 2018-10-29 ENCOUNTER — Other Ambulatory Visit: Payer: Self-pay

## 2018-10-29 DIAGNOSIS — Z510 Encounter for antineoplastic radiation therapy: Secondary | ICD-10-CM | POA: Diagnosis not present

## 2018-10-30 ENCOUNTER — Other Ambulatory Visit: Payer: Self-pay

## 2018-10-30 ENCOUNTER — Ambulatory Visit
Admission: RE | Admit: 2018-10-30 | Discharge: 2018-10-30 | Disposition: A | Discharge status: Home / Self Care | Payer: Medicare Other | Source: Ambulatory Visit | Attending: Radiation Oncology | Admitting: Radiation Oncology

## 2018-10-30 DIAGNOSIS — Z510 Encounter for antineoplastic radiation therapy: Secondary | ICD-10-CM | POA: Diagnosis not present

## 2018-11-02 ENCOUNTER — Other Ambulatory Visit: Payer: Self-pay

## 2018-11-02 ENCOUNTER — Ambulatory Visit
Admission: RE | Admit: 2018-11-02 | Discharge: 2018-11-02 | Disposition: A | Discharge status: Home / Self Care | Payer: Medicare Other | Source: Ambulatory Visit | Attending: Radiation Oncology | Admitting: Radiation Oncology

## 2018-11-02 DIAGNOSIS — Z510 Encounter for antineoplastic radiation therapy: Secondary | ICD-10-CM | POA: Diagnosis not present

## 2018-11-03 ENCOUNTER — Other Ambulatory Visit: Payer: Self-pay

## 2018-11-03 ENCOUNTER — Ambulatory Visit
Admission: RE | Admit: 2018-11-03 | Discharge: 2018-11-03 | Disposition: A | Discharge status: Home / Self Care | Payer: Medicare Other | Source: Ambulatory Visit | Attending: Radiation Oncology | Admitting: Radiation Oncology

## 2018-11-03 DIAGNOSIS — Z510 Encounter for antineoplastic radiation therapy: Secondary | ICD-10-CM | POA: Diagnosis not present

## 2018-11-04 ENCOUNTER — Other Ambulatory Visit: Payer: Self-pay

## 2018-11-04 ENCOUNTER — Ambulatory Visit
Admission: RE | Admit: 2018-11-04 | Discharge: 2018-11-04 | Disposition: A | Discharge status: Home / Self Care | Payer: Medicare Other | Source: Ambulatory Visit | Attending: Radiation Oncology | Admitting: Radiation Oncology

## 2018-11-04 DIAGNOSIS — Z510 Encounter for antineoplastic radiation therapy: Secondary | ICD-10-CM | POA: Diagnosis not present

## 2018-11-05 ENCOUNTER — Other Ambulatory Visit: Payer: Self-pay

## 2018-11-05 ENCOUNTER — Ambulatory Visit
Admission: RE | Admit: 2018-11-05 | Discharge: 2018-11-05 | Disposition: A | Discharge status: Home / Self Care | Payer: Medicare Other | Source: Ambulatory Visit | Attending: Radiation Oncology | Admitting: Radiation Oncology

## 2018-11-05 DIAGNOSIS — Z510 Encounter for antineoplastic radiation therapy: Secondary | ICD-10-CM | POA: Diagnosis not present

## 2018-11-06 ENCOUNTER — Other Ambulatory Visit: Payer: Self-pay

## 2018-11-06 ENCOUNTER — Ambulatory Visit
Admission: RE | Admit: 2018-11-06 | Discharge: 2018-11-06 | Disposition: A | Discharge status: Home / Self Care | Payer: Medicare Other | Source: Ambulatory Visit | Attending: Radiation Oncology | Admitting: Radiation Oncology

## 2018-11-06 DIAGNOSIS — Z510 Encounter for antineoplastic radiation therapy: Secondary | ICD-10-CM | POA: Diagnosis not present

## 2018-11-09 ENCOUNTER — Ambulatory Visit
Admission: RE | Admit: 2018-11-09 | Discharge: 2018-11-09 | Disposition: A | Discharge status: Home / Self Care | Payer: Medicare Other | Source: Ambulatory Visit | Attending: Radiation Oncology | Admitting: Radiation Oncology

## 2018-11-09 ENCOUNTER — Other Ambulatory Visit: Payer: Self-pay

## 2018-11-09 DIAGNOSIS — Z510 Encounter for antineoplastic radiation therapy: Secondary | ICD-10-CM | POA: Diagnosis not present

## 2018-11-10 ENCOUNTER — Ambulatory Visit
Admission: RE | Admit: 2018-11-10 | Discharge: 2018-11-10 | Disposition: A | Discharge status: Home / Self Care | Payer: Medicare Other | Source: Ambulatory Visit | Attending: Radiation Oncology | Admitting: Radiation Oncology

## 2018-11-10 ENCOUNTER — Other Ambulatory Visit: Payer: Self-pay

## 2018-11-10 DIAGNOSIS — Z510 Encounter for antineoplastic radiation therapy: Secondary | ICD-10-CM | POA: Diagnosis not present

## 2018-11-11 ENCOUNTER — Other Ambulatory Visit: Payer: Self-pay

## 2018-11-11 ENCOUNTER — Ambulatory Visit
Admission: RE | Admit: 2018-11-11 | Discharge: 2018-11-11 | Disposition: A | Discharge status: Home / Self Care | Payer: Medicare Other | Source: Ambulatory Visit | Attending: Radiation Oncology | Admitting: Radiation Oncology

## 2018-11-11 DIAGNOSIS — Z510 Encounter for antineoplastic radiation therapy: Secondary | ICD-10-CM | POA: Diagnosis not present

## 2018-11-12 ENCOUNTER — Ambulatory Visit
Admission: RE | Admit: 2018-11-12 | Discharge: 2018-11-12 | Disposition: A | Discharge status: Home / Self Care | Payer: Medicare Other | Source: Ambulatory Visit | Attending: Radiation Oncology | Admitting: Radiation Oncology

## 2018-11-12 ENCOUNTER — Other Ambulatory Visit: Payer: Self-pay

## 2018-11-12 DIAGNOSIS — Z510 Encounter for antineoplastic radiation therapy: Secondary | ICD-10-CM | POA: Diagnosis not present

## 2018-11-13 ENCOUNTER — Other Ambulatory Visit: Payer: Self-pay

## 2018-11-13 ENCOUNTER — Ambulatory Visit
Admission: RE | Admit: 2018-11-13 | Discharge: 2018-11-13 | Disposition: A | Discharge status: Home / Self Care | Payer: Medicare Other | Source: Ambulatory Visit | Attending: Radiation Oncology | Admitting: Radiation Oncology

## 2018-11-13 DIAGNOSIS — Z510 Encounter for antineoplastic radiation therapy: Secondary | ICD-10-CM | POA: Diagnosis not present

## 2018-11-16 ENCOUNTER — Encounter: Payer: Self-pay | Admitting: Radiation Oncology

## 2018-11-16 ENCOUNTER — Other Ambulatory Visit: Payer: Self-pay

## 2018-11-16 ENCOUNTER — Ambulatory Visit
Admission: RE | Admit: 2018-11-16 | Discharge: 2018-11-16 | Disposition: A | Discharge status: Home / Self Care | Payer: Medicare Other | Source: Ambulatory Visit | Attending: Radiation Oncology | Admitting: Radiation Oncology

## 2018-11-16 DIAGNOSIS — Z510 Encounter for antineoplastic radiation therapy: Secondary | ICD-10-CM | POA: Diagnosis not present

## 2018-12-13 NOTE — Progress Notes (Signed)
  Radiation Oncology         [phone]) [phone] ________________________________  Name: Jonathan Washington MRN: [id number]  Date: 11/16/2018  DOB: 11-02-1955  End of Treatment Note  Diagnosis:   63 y.o. gentleman with a biochemical recurrence of pT2b N1 MX Gleason 3+3 adenocarcinoma of the prostate with current PSA of 0.36.     Indication for treatment:  Curative, Definitive Radiotherapy       Radiation treatment dates:   09/23/18-11/16/18  Site/dose:  1. The prostate fossa and pelvic lymph nodes were initially treated to 45 Gy in 25 fractions of 1.8 Gy  2. The prostate fossa only was boosted to 68.4 Gy with 13 additional fractions of 1.8 Gy   Beams/energy:  1. The prostate fossa  and pelvic lymph nodes were initially treated using VMAT intensity modulated radiotherapy delivering 6 megavolt photons. Image guidance was performed with CB-CT studies prior to each fraction. He was immobilized with a body fix lower extremity mold.  2. The prostate fossa only was boosted using VMAT intensity modulated radiotherapy delivering 6 megavolt photons. Image guidance was performed with CB-CT studies prior to each fraction. He was immobilized with a body fix lower extremity mold.  Narrative: The patient tolerated radiation treatment relatively well.   The patient experienced some minor urinary irritation and modest fatigue.    Plan: The patient has completed radiation treatment. He will return to radiation oncology clinic for routine followup in one month. I advised him to call or return sooner if he has any questions or concerns related to his recovery or treatment. ________________________________  Sheral Apley. Tammi Klippel, M.D.

## 2018-12-17 ENCOUNTER — Encounter: Payer: Self-pay | Admitting: Urology

## 2018-12-17 ENCOUNTER — Other Ambulatory Visit: Payer: Self-pay

## 2018-12-17 ENCOUNTER — Ambulatory Visit
Admission: RE | Admit: 2018-12-17 | Discharge: 2018-12-17 | Disposition: A | Discharge status: Home / Self Care | Payer: Medicare Other | Source: Ambulatory Visit | Attending: Urology | Admitting: Urology

## 2018-12-17 DIAGNOSIS — H832X2 Labyrinthine dysfunction, left ear: Secondary | ICD-10-CM | POA: Insufficient documentation

## 2018-12-17 DIAGNOSIS — C61 Malignant neoplasm of prostate: Secondary | ICD-10-CM

## 2018-12-17 NOTE — Progress Notes (Signed)
Radiation Oncology         [phone] ________________________________  Name: Jonathan Washington MRN: [id number]  Date: 12/17/2018  DOB: 1956/04/21  Post Treatment Note  CC: Hulan Fess, MD  Ardis Hughs, MD  Diagnosis:   63 y.o. gentleman with a biochemical recurrence of pT2b N1 MX Gleason 3+3 adenocarcinoma of the prostate with current PSA of 0.36.   Interval Since Last Radiation:  4.5 weeks  09/23/18-11/16/18: 1. The prostate fossa and pelvic lymph nodes were initially treated to 45 Gy in 25 fractions of 1.8 Gy  2. The prostate fossa only was boosted to 68.4 Gy with 13 additional fractions of 1.8 Gy    Narrative:  I spoke with the patient to conduct his routine scheduled 1 month follow up visit via telephone to spare the patient unnecessary potential exposure in the healthcare setting during the current COVID-19 pandemic.  The patient was notified in advance and gave permission to proceed with this visit format.   He tolerated radiation treatment relatively well with only minor urinary irritation and modest fatigue.                              On review of systems, the patient states that he is doing well overall.  He has noticed a gradual improvement in his LUTS with only mild residual urgency and daytime frequency but nocturia has returned to baseline at 1 time per night.  He specifically denies gross hematuria, dysuria, incomplete bladder emptying, straining to void or incontinence.  He reports a healthy appetite and is maintaining his weight.  He denies abdominal pain, nausea, vomiting, diarrhea or constipation.  He continues with mild fatigue but this is gradually improving.  Overall, he is quite pleased with his progress to date.  ALLERGIES:  has No Known Allergies.  Meds: Current Outpatient Medications  Medication Sig Dispense Refill  . amLODipine (NORVASC) 2.5 MG tablet TAKE 1 TABLET BY MOUTH ONCE DAILY FOR 30 DAYS    . aspirin 81 MG tablet Take 81 mg by mouth.     . enalapril (VASOTEC) 5 MG tablet Take 5 mg by mouth every morning.    Marland Kitchen imipramine (TOFRANIL) 10 MG tablet Take 10 mg by mouth at bedtime.    Marland Kitchen losartan (COZAAR) 100 MG tablet Take 100 mg by mouth daily.    . Multiple Vitamins-Minerals (MULTIVITAMIN ADULT PO) Take by mouth.    Marland Kitchen omeprazole (PRILOSEC) 20 MG capsule Take 20 mg by mouth daily as needed. For heartburn    . Pomegranate, Punica granatum, (POMEGRANATE EXTRACT PO) Take by mouth.    . rosuvastatin (CRESTOR) 5 MG tablet Take 5 mg by mouth daily.    . timolol (TIMOPTIC) 0.5 % ophthalmic solution Place 1 drop into the right eye 2 (two) times daily.    Marland Kitchen KRILL OIL PO Take by mouth.    . prochlorperazine (COMPAZINE) 10 MG tablet Take 1 tablet (10 mg total) by mouth every 6 (six) hours as needed for nausea or vomiting (Take 30 min prior to radiation treatment). (Patient not taking: Reported on 12/17/2018) 30 tablet 0  . sildenafil (VIAGRA) 100 MG tablet Take 100 mg by mouth daily as needed for erectile dysfunction.     No current facility-administered medications for this encounter.     Physical Findings:  vitals were not taken for this visit.   /Unable to assess due to telephone follow-up visit format. Lab Findings: Lab Results  Component Value Date   WBC 6.6 05/18/2012   HGB 13.7 05/28/2012   HCT 38.6 (L) 05/28/2012   MCV 87.5 05/18/2012   PLT 261 05/18/2012   Radiographic Findings: No results found.  Impression/Plan: 1. 63 y.o. gentleman with a biochemical recurrence of pT2b N1 MX Gleason 3+3 adenocarcinoma of the prostate with current PSA of 0.36.  He will continue to follow up with urology for ongoing PSA determinations and has an appointment scheduled with Dr. Louis Meckel on January 11, 2019. He understands what to expect with regards to PSA monitoring going forward. I will look forward to following his response to treatment via correspondence with urology, and would be happy to continue to participate in his care if clinically  indicated. I talked to the patient about what to expect in the future, including his risk for erectile dysfunction and rectal bleeding. I encouraged him to call or return to the office if he has any questions regarding his previous radiation or possible radiation side effects. He was comfortable with this plan and will follow up as needed.   Nicholos Johns, PA-C

## 2019-02-19 ENCOUNTER — Other Ambulatory Visit: Payer: Self-pay | Admitting: Family Medicine

## 2019-02-19 DIAGNOSIS — Z136 Encounter for screening for cardiovascular disorders: Secondary | ICD-10-CM

## 2019-02-25 ENCOUNTER — Ambulatory Visit
Admission: RE | Admit: 2019-02-25 | Discharge: 2019-02-25 | Disposition: A | Discharge status: Home / Self Care | Payer: Medicare Other | Source: Ambulatory Visit | Attending: Family Medicine | Admitting: Family Medicine

## 2019-02-25 DIAGNOSIS — Z136 Encounter for screening for cardiovascular disorders: Secondary | ICD-10-CM

## 2019-11-15 LAB — HM DIABETES EYE EXAM

## 2019-12-01 ENCOUNTER — Other Ambulatory Visit: Payer: Self-pay

## 2019-12-01 ENCOUNTER — Ambulatory Visit (INDEPENDENT_AMBULATORY_CARE_PROVIDER_SITE_OTHER): Payer: Medicare Other | Admitting: Orthopaedic Surgery

## 2019-12-01 ENCOUNTER — Ambulatory Visit (INDEPENDENT_AMBULATORY_CARE_PROVIDER_SITE_OTHER): Payer: Medicare Other

## 2019-12-01 ENCOUNTER — Encounter: Payer: Self-pay | Admitting: Orthopaedic Surgery

## 2019-12-01 VITALS — Ht 71.0 in | Wt 194.0 lb

## 2019-12-01 DIAGNOSIS — M1812 Unilateral primary osteoarthritis of first carpometacarpal joint, left hand: Secondary | ICD-10-CM

## 2019-12-01 DIAGNOSIS — M79642 Pain in left hand: Secondary | ICD-10-CM

## 2019-12-01 DIAGNOSIS — M1810 Unilateral primary osteoarthritis of first carpometacarpal joint, unspecified hand: Secondary | ICD-10-CM | POA: Insufficient documentation

## 2019-12-01 MED ORDER — LIDOCAINE HCL 1 % IJ SOLN
0.5000 mL | INTRAMUSCULAR | Status: AC | PRN
  Administered 2019-12-01: .5 mL

## 2019-12-01 MED ORDER — METHYLPREDNISOLONE ACETATE 40 MG/ML IJ SUSP
20.0000 mg | INTRAMUSCULAR | Status: AC | PRN
  Administered 2019-12-01: 20 mg

## 2019-12-01 NOTE — Progress Notes (Signed)
Office Visit Note   Patient: Jonathan Washington           Date of Birth: [birth year]           MRN: [id number] Visit Date: 12/01/2019              Requested by: Hulan Fess, MD [place],  [place] [id number] PCP: Hulan Fess, MD   Assessment & Plan: Visit Diagnoses:  1. Pain in left hand   2. Arthritis of carpometacarpal (CMC) joint of thumb     Plan: Advanced osteoarthritis left dominant thumb carpal metacarpal joint.  Long discussion regarding treatment options including surgery.  Mr. Delduca like it a cortisone injection in his splint which we will apply  Follow-Up Instructions: Return if symptoms worsen or fail to improve.   Orders:  Orders Placed This Encounter  Procedures  . Hand/UE Inj: L thumb CMC  . XR Hand Complete Left   No orders of the defined types were placed in this encounter.     Procedures: Hand/UE Inj: L thumb CMC for osteoarthritis on 12/01/2019 2:33 PM Details: dorsal approach Medications: 0.5 mL lidocaine 1 %; 20 mg methylPREDNISolone acetate 40 MG/ML      Clinical Data: No additional findings.   Subjective: Chief Complaint  Patient presents with  . Left Hand - Pain  Patient presents today for left hand pain. No known injury. He said that it started about 3-4 months ago. His pain is located at the base of his thumb. Swelling. He was taking Ibuprofen and that seemed to helped, but now it does not seem to be helping. He is left hand dominant. No previous left hand surgery.   HPI  Review of Systems   Objective: Vital Signs: Ht 5\' 11"  (1.803 m)   Wt 194 lb (88 kg)   BMI 27.06 kg/m   Physical Exam Constitutional:      Appearance: He is well-developed.  Eyes:     Pupils: Pupils are equal, round, and reactive to light.  Pulmonary:     Effort: Pulmonary effort is normal.  Skin:    General: Skin is warm and dry.  Neurological:     Mental Status: He is alert and oriented to person, place, and time.  Psychiatric:         Behavior: Behavior normal.     Ortho Exam awake alert and oriented x3.  Left thumb with swelling and pain at the thumb carpometacarpal joint.  There is some subluxation and deformity with decreased grip.  Neurologically intact.  Local tenderness.  I could not relocate the thumb subluxation.  Specialty Comments:  No specialty comments available.  Imaging: XR Hand Complete Left  Result Date: 12/01/2019 Films of the left hand were obtained in several projections.  There is considerable degenerative change at the base of the thumb carpometacarpal joint with subluxation of the joint and considerable ectopic calcification    PMFS History: Patient Active Problem List   Diagnosis Date Noted  . Arthritis of carpometacarpal (CMC) joint of thumb 12/01/2019  . Vestibular hypofunction of left ear 12/17/2018  . Malignant neoplasm of prostate ([place]) 08/11/2018  . Sensorineural hearing loss (SNHL), bilateral 01/06/2018  . Vertigo 01/06/2018   Past Medical History:  Diagnosis Date  . Dyslipidemia   . Fatty liver   . Fibromyalgia   . GERD (gastroesophageal reflux disease)   . Glaucoma    RIGHT EYE  . Glucose intolerance (impaired glucose tolerance)   . Hypertension   .  Pneumonia    4 YRS AGO  . Prostate cancer ([place])   . Vertigo    ESPECIALLY IF PT TURNS HIS HEAD OR MOVES A CERTAIN WAY    Family History  Problem Relation Age of Onset  . Breast cancer Mother   . Bladder Cancer Father   . Throat cancer Father   . Lung cancer Paternal Aunt        non smoker    Past Surgical History:  Procedure Laterality Date  . CHOLECYSTECTOMY    . LEFT KNEE ARTHROSCOPY    . LYMPHADENECTOMY  05/27/2012   Procedure: LYMPHADENECTOMY;  Surgeon: Bernestine Amass, MD;  Location: [facility] ORS;  Service: Urology;  Laterality: Bilateral;  . ROBOT ASSISTED LAPAROSCOPIC RADICAL PROSTATECTOMY  05/27/2012   Procedure: ROBOTIC ASSISTED LAPAROSCOPIC RADICAL PROSTATECTOMY;  Surgeon: Bernestine Amass, MD;  Location: [facility]  ORS;  Service: Urology;  Laterality: N/A;  . SURGERY FOR DEVIATED NASAL SEPTUM     Social History   Occupational History    Comment: retired since 2015  Tobacco Use  . Smoking status: Former Smoker    Packs/day: 2.00    Years: 15.00    Pack years: 30.00    Types: Cigarettes    Quit date: 04/22/1978    Years since quitting: 41.6  . Smokeless tobacco: Never Used  . Tobacco comment: QUIT SMOKING 1980  Vaping Use  . Vaping Use: Never used  Substance and Sexual Activity  . Alcohol use: Yes    Comment: 1 0R 2 BEERS DAILY  . Drug use: No  . Sexual activity: Yes    Comment: with aid of Viagra

## 2020-07-18 ENCOUNTER — Ambulatory Visit: Payer: Self-pay | Admitting: Family

## 2020-07-25 ENCOUNTER — Ambulatory Visit: Payer: Self-pay | Admitting: Family

## 2020-07-27 ENCOUNTER — Encounter: Payer: Self-pay | Admitting: Family

## 2020-07-27 ENCOUNTER — Other Ambulatory Visit: Payer: Self-pay

## 2020-07-27 ENCOUNTER — Ambulatory Visit (INDEPENDENT_AMBULATORY_CARE_PROVIDER_SITE_OTHER): Payer: Medicare Other | Admitting: Family

## 2020-07-27 VITALS — BP 105/63 | HR 74 | Temp 97.7°F | Ht 71.0 in | Wt 190.2 lb

## 2020-07-27 DIAGNOSIS — Z794 Long term (current) use of insulin: Secondary | ICD-10-CM | POA: Diagnosis not present

## 2020-07-27 DIAGNOSIS — K219 Gastro-esophageal reflux disease without esophagitis: Secondary | ICD-10-CM | POA: Insufficient documentation

## 2020-07-27 DIAGNOSIS — E119 Type 2 diabetes mellitus without complications: Secondary | ICD-10-CM | POA: Insufficient documentation

## 2020-07-27 DIAGNOSIS — E1169 Type 2 diabetes mellitus with other specified complication: Secondary | ICD-10-CM

## 2020-07-27 DIAGNOSIS — R42 Dizziness and giddiness: Secondary | ICD-10-CM

## 2020-07-27 DIAGNOSIS — Z8546 Personal history of malignant neoplasm of prostate: Secondary | ICD-10-CM

## 2020-07-27 DIAGNOSIS — E785 Hyperlipidemia, unspecified: Secondary | ICD-10-CM

## 2020-07-27 NOTE — Progress Notes (Signed)
Subjective:    Patient ID: Jonathan Washington, male    DOB: 1955/11/13, 65 y.o.   MRN: [id number]  Chief Complaint  Patient presents with  . New Patient (Initial Visit)   Pt presents to the office today to establish care. He is followed by Urologists every 6 months for hx of Prostate Cancer 2014. He is followed by Endo every 3 months for DM. His last A1C was 6.0 that was drawn in March. He states he does exercising class twice a week for 45 mins.  Diabetes He presents for his follow-up diabetic visit. He has type 2 diabetes mellitus. His disease course has been stable. Hypoglycemia symptoms include dizziness. Associated symptoms include foot paresthesias. Pertinent negatives for diabetes include no blurred vision. Symptoms are stable. Pertinent negatives for diabetic complications include no CVA, heart disease, nephropathy or peripheral neuropathy. Risk factors for coronary artery disease include dyslipidemia, diabetes mellitus, hypertension, sedentary lifestyle and post-menopausal. He is following a generally healthy diet. His overall blood glucose range is 110-130 mg/dl. An ACE inhibitor/angiotensin II receptor blocker is being taken.  Hyperlipidemia This is a chronic problem. The current episode started more than 1 year ago. The problem is controlled. Exacerbating diseases include obesity. Current antihyperlipidemic treatment includes statins. The current treatment provides moderate improvement of lipids. Risk factors for coronary artery disease include dyslipidemia, male sex, hypertension and a sedentary lifestyle.  Gastroesophageal Reflux He complains of belching and heartburn. This is a chronic problem. The current episode started more than 1 year ago. The problem occurs occasionally. He has tried a PPI for the symptoms. The treatment provided moderate relief.  Arthritis Presents for follow-up visit. He complains of pain and stiffness. The symptoms have been stable. Affected locations  include the left MCP.  Dizziness This is a recurrent problem. The problem occurs intermittently. The problem has been waxing and waning. The treatment provided no relief.      Review of Systems  Eyes: Negative for blurred vision.  Gastrointestinal: Positive for heartburn.  Musculoskeletal: Positive for arthritis and stiffness.  Neurological: Positive for dizziness.  All other systems reviewed and are negative.  Family History  Problem Relation Age of Onset  . Breast cancer Mother   . Bladder Cancer Father   . Throat cancer Father   . Lung cancer Paternal Aunt        non smoker   Social History   Socioeconomic History  . Marital status: Married    Spouse name: Not on file  . Number of children: 2  . Years of education: Not on file  . Highest education level: Not on file  Occupational History    Comment: retired since 2015  Tobacco Use  . Smoking status: Former Smoker    Packs/day: 2.00    Years: 15.00    Pack years: 30.00    Types: Cigarettes    Quit date: 04/22/1978    Years since quitting: 42.2  . Smokeless tobacco: Never Used  . Tobacco comment: QUIT SMOKING 1980  Vaping Use  . Vaping Use: Never used  Substance and Sexual Activity  . Alcohol use: Yes    Comment: 1 0R 2 BEERS DAILY  . Drug use: No  . Sexual activity: Yes    Comment: with aid of Viagra  Other Topics Concern  . Not on file  Social History Narrative  . Not on file   Social Determinants of Health   Financial Resource Strain: Not on file  Food Insecurity: Not on file  Transportation Needs: Not on file  Physical Activity: Not on file  Stress: Not on file  Social Connections: Not on file       Objective:   Physical Exam Vitals reviewed.  Constitutional:      General: He is not in acute distress.    Appearance: He is well-developed.  HENT:     Head: Normocephalic.     Right Ear: Tympanic membrane normal.     Left Ear: Tympanic membrane normal.  Eyes:     General:        Right eye:  No discharge.        Left eye: No discharge.     Pupils: Pupils are equal, round, and reactive to light.  Neck:     Thyroid: No thyromegaly.  Cardiovascular:     Rate and Rhythm: Normal rate and regular rhythm.     Heart sounds: Normal heart sounds. No murmur heard.   Pulmonary:     Effort: Pulmonary effort is normal. No respiratory distress.     Breath sounds: Normal breath sounds. No wheezing.  Abdominal:     General: Bowel sounds are normal. There is no distension.     Palpations: Abdomen is soft.     Tenderness: There is no abdominal tenderness.  Musculoskeletal:        General: No tenderness. Normal range of motion.     Cervical back: Normal range of motion and neck supple.  Skin:    General: Skin is warm and dry.     Findings: No erythema or rash.  Neurological:     Mental Status: He is alert and oriented to person, place, and time.     Cranial Nerves: No cranial nerve deficit.     Deep Tendon Reflexes: Reflexes are normal and symmetric.  Psychiatric:        Behavior: Behavior normal.        Thought Content: Thought content normal.        Judgment: Judgment normal.    Diabetic Foot Exam - Simple   Simple Foot Form Diabetic Foot exam was performed with the following findings: Yes 07/27/2020  2:40 PM  Visual Inspection No deformities, no ulcerations, no other skin breakdown bilaterally: Yes Sensation Testing Intact to touch and monofilament testing bilaterally: Yes Pulse Check Posterior Tibialis and Dorsalis pulse intact bilaterally: Yes Comments       BP 105/63   Pulse 74   Temp 97.7 F (36.5 C) (Temporal)   Ht 5\' 11"  (1.803 m)   Wt 190 lb 3.2 oz (86.3 kg)   BMI 26.53 kg/m      Assessment & Plan:  Jonathan Washington comes in today with chief complaint of New Patient (Initial Visit)   Diagnosis and orders addressed:  1. Type 2 diabetes mellitus with other specified complication, with long-term current use of insulin ([place])  2. Hyperlipidemia  associated with type 2 diabetes mellitus ([place])  3. Gastroesophageal reflux disease, unspecified whether esophagitis present  4. H/O prostate cancer  5. Vertigo   Labs reviewed and will scan into chart Health Maintenance reviewed Diet and exercise encouraged  Follow up plan: 6 months    Evelina Dun, FNP

## 2020-07-27 NOTE — Patient Instructions (Signed)
Health Maintenance After Age 67 After age 67, you are at a higher risk for certain long-term diseases and infections as well as injuries from falls. Falls are a major cause of broken bones and head injuries in people who are older than age 67. Getting regular preventive care can help to keep you healthy and well. Preventive care includes getting regular testing and making lifestyle changes as recommended by your health care provider. Talk with your health care provider about:  Which screenings and tests you should have. A screening is a test that checks for a disease when you have no symptoms.  A diet and exercise plan that is right for you. What should I know about screenings and tests to prevent falls? Screening and testing are the best ways to find a health problem early. Early diagnosis and treatment give you the best chance of managing medical conditions that are common after age 67. Certain conditions and lifestyle choices may make you more likely to have a fall. Your health care provider may recommend:  Regular vision checks. Poor vision and conditions such as cataracts can make you more likely to have a fall. If you wear glasses, make sure to get your prescription updated if your vision changes.  Medicine review. Work with your health care provider to regularly review all of the medicines you are taking, including over-the-counter medicines. Ask your health care provider about any side effects that may make you more likely to have a fall. Tell your health care provider if any medicines that you take make you feel dizzy or sleepy.  Osteoporosis screening. Osteoporosis is a condition that causes the bones to get weaker. This can make the bones weak and cause them to break more easily.  Blood pressure screening. Blood pressure changes and medicines to control blood pressure can make you feel dizzy.  Strength and balance checks. Your health care provider may recommend certain tests to check your  strength and balance while standing, walking, or changing positions.  Foot health exam. Foot pain and numbness, as well as not wearing proper footwear, can make you more likely to have a fall.  Depression screening. You may be more likely to have a fall if you have a fear of falling, feel emotionally low, or feel unable to do activities that you used to do.  Alcohol use screening. Using too much alcohol can affect your balance and may make you more likely to have a fall. What actions can I take to lower my risk of falls? General instructions  Talk with your health care provider about your risks for falling. Tell your health care provider if: ? You fall. Be sure to tell your health care provider about all falls, even ones that seem minor. ? You feel dizzy, sleepy, or off-balance.  Take over-the-counter and prescription medicines only as told by your health care provider. These include any supplements.  Eat a healthy diet and maintain a healthy weight. A healthy diet includes low-fat dairy products, low-fat (lean) meats, and fiber from whole grains, beans, and lots of fruits and vegetables. Home safety  Remove any tripping hazards, such as rugs, cords, and clutter.  Install safety equipment such as grab bars in bathrooms and safety rails on stairs.  Keep rooms and walkways well-lit. Activity  Follow a regular exercise program to stay fit. This will help you maintain your balance. Ask your health care provider what types of exercise are appropriate for you.  If you need a cane or walker,   use it as recommended by your health care provider.  Wear supportive shoes that have nonskid soles.   Lifestyle  Do not drink alcohol if your health care provider tells you not to drink.  If you drink alcohol, limit how much you have: ? 0-1 drink a day for women. ? 0-2 drinks a day for men.  Be aware of how much alcohol is in your drink. In the [place] one drink equals one typical bottle of beer (12  oz), one-half glass of wine (5 oz), or one shot of hard liquor (1 oz).  Do not use any products that contain nicotine or tobacco, such as cigarettes and e-cigarettes. If you need help quitting, ask your health care provider. Summary  Having a healthy lifestyle and getting preventive care can help to protect your health and wellness after age 67.  Screening and testing are the best way to find a health problem early and help you avoid having a fall. Early diagnosis and treatment give you the best chance for managing medical conditions that are more common for people who are older than age 67.  Falls are a major cause of broken bones and head injuries in people who are older than age 67. Take precautions to prevent a fall at home.  Work with your health care provider to learn what changes you can make to improve your health and wellness and to prevent falls. This information is not intended to replace advice given to you by your health care provider. Make sure you discuss any questions you have with your health care provider. Document Revised: 07/30/2018 Document Reviewed: 02/19/2017 Elsevier Patient Education  2021 Elsevier Inc.  

## 2020-08-24 ENCOUNTER — Other Ambulatory Visit: Payer: Self-pay | Admitting: *Deleted

## 2020-08-25 NOTE — Telephone Encounter (Signed)
Can you verify he is taking both of these.

## 2020-08-25 NOTE — Telephone Encounter (Signed)
Lmtcb.

## 2020-08-28 ENCOUNTER — Telehealth: Payer: Self-pay

## 2020-08-30 ENCOUNTER — Encounter: Payer: Self-pay | Admitting: Family Medicine

## 2020-08-30 NOTE — Telephone Encounter (Signed)
Pt returned missed call to confirm that he is still taking his Amlodipine 2.5mg  and Imipramine 10mg  on a daily basis. Pt is just out of meds and needs refills sent to [facility] in [place] ASAP.  Please call pt once refills have been sent.

## 2020-08-31 ENCOUNTER — Encounter: Payer: Self-pay | Admitting: Family

## 2020-08-31 MED ORDER — AMLODIPINE BESYLATE 2.5 MG PO TABS: ORAL_TABLET | ORAL | 2 refills | Status: DC

## 2020-08-31 MED ORDER — IMIPRAMINE HCL 10 MG PO TABS: 10.0000 mg | ORAL_TABLET | Freq: Every day | ORAL | 2 refills | Status: DC

## 2020-12-18 ENCOUNTER — Ambulatory Visit (INDEPENDENT_AMBULATORY_CARE_PROVIDER_SITE_OTHER): Payer: Medicare Other

## 2020-12-18 ENCOUNTER — Ambulatory Visit (INDEPENDENT_AMBULATORY_CARE_PROVIDER_SITE_OTHER): Payer: Medicare Other | Admitting: Family

## 2020-12-18 ENCOUNTER — Encounter: Payer: Self-pay | Admitting: Family

## 2020-12-18 ENCOUNTER — Other Ambulatory Visit: Payer: Self-pay

## 2020-12-18 ENCOUNTER — Other Ambulatory Visit: Payer: Self-pay | Admitting: Family Medicine

## 2020-12-18 VITALS — BP 117/72 | HR 74 | Temp 98.4°F | Ht 71.0 in | Wt 187.6 lb

## 2020-12-18 DIAGNOSIS — S60221A Contusion of right hand, initial encounter: Secondary | ICD-10-CM

## 2020-12-18 DIAGNOSIS — M25531 Pain in right wrist: Secondary | ICD-10-CM | POA: Diagnosis not present

## 2020-12-18 DIAGNOSIS — W19XXXA Unspecified fall, initial encounter: Secondary | ICD-10-CM

## 2020-12-18 DIAGNOSIS — M79641 Pain in right hand: Secondary | ICD-10-CM

## 2020-12-18 MED ORDER — DICLOFENAC SODIUM 75 MG PO TBEC: 75.0000 mg | DELAYED_RELEASE_TABLET | Freq: Two times a day (BID) | ORAL | 0 refills | Status: DC

## 2020-12-18 NOTE — Patient Instructions (Signed)
Contusion A contusion is a deep bruise. Contusions are the result of a blunt injury to tissues and muscle fibers under the skin. The injury causes bleeding under the skin. The skin overlying the contusion may turn blue, purple, or yellow. Minor injuries will give you a painless contusion, but more severe injuries causecontusions that may stay painful and swollen for a few weeks. Follow these instructions at home: Pay attention to any changes in your symptoms. Let your health care providerknow about them. Take these actions to relieve your pain. Managing pain, stiffness, and swelling  Use resting, icing, applying pressure (compression), and raising (elevating) the injured area. This is often called the RICE strategy. Rest the injured area. Return to your normal activities as told by your health care provider. Ask your health care provider what activities are safe for you. If directed, put ice on the injured area: Put ice in a plastic bag. Place a towel between your skin and the bag. Leave the ice on for 20 minutes, 2-3 times per day. If directed, apply light compression to the injured area using an elastic bandage. Make sure the bandage is not wrapped too tightly. Remove and reapply the bandage as directed by your health care provider. If possible, raise (elevate) the injured area above the level of your heart while you are sitting or lying down.  General instructions Take over-the-counter and prescription medicines only as told by your health care provider. Keep all follow-up visits as told by your health care provider. This is important. Contact a health care provider if: Your symptoms do not improve after several days of treatment. Your symptoms get worse. You have difficulty moving the injured area. Get help right away if: You have severe pain. You have numbness in a hand or foot. Your hand or foot turns pale or cold. Summary A contusion is a deep bruise. Contusions are the result of a  blunt injury to tissues and muscle fibers under the skin. It is treated with rest, ice, compression, and elevation. You may be given over-the-counter medicines for pain. Contact a health care provider if your symptoms do not improve, or get worse. Get help right away if you have severe pain, have numbness, or the area turns pale or cold. This information is not intended to replace advice given to you by your health care provider. Make sure you discuss any questions you have with your healthcare provider. Document Revised: 11/27/2017 Document Reviewed: 11/27/2017 Elsevier Patient Education  [place].

## 2020-12-18 NOTE — Progress Notes (Signed)
Subjective:    Patient ID: Jonathan Washington, male    DOB: [birth year], 65 y.o.   MRN: [id number]  Chief Complaint  Patient presents with   Fall    Thursday hurt right hand      Fall The accident occurred 5 to 7 days ago. There was no blood loss. Point of impact: right hand. The pain is present in the right hand. The pain is at a severity of 10/10. The pain is moderate. The symptoms are aggravated by flexion. Pertinent negatives include no bowel incontinence, fever, hematuria, loss of consciousness, tingling or visual change. He has tried acetaminophen for the symptoms. The treatment provided mild relief.     Review of Systems  Constitutional:  Negative for fever.  Gastrointestinal:  Negative for bowel incontinence.  Genitourinary:  Negative for hematuria.  Neurological:  Negative for tingling and loss of consciousness.  All other systems reviewed and are negative.     Objective:   Physical Exam Vitals reviewed.  Constitutional:      General: He is not in acute distress.    Appearance: He is well-developed.  HENT:     Head: Normocephalic.  Eyes:     General:        Right eye: No discharge.        Left eye: No discharge.     Pupils: Pupils are equal, round, and reactive to light.  Neck:     Thyroid: No thyromegaly.  Cardiovascular:     Rate and Rhythm: Normal rate and regular rhythm.     Heart sounds: Normal heart sounds. No murmur heard. Pulmonary:     Effort: Pulmonary effort is normal. No respiratory distress.     Breath sounds: Normal breath sounds. No wheezing.  Abdominal:     General: Bowel sounds are normal. There is no distension.     Palpations: Abdomen is soft.     Tenderness: There is no abdominal tenderness.  Musculoskeletal:        General: Swelling and tenderness present.     Cervical back: Normal range of motion and neck supple.     Comments: Ecchymosis and swelling on lateral right hand  Skin:    General: Skin is warm and dry.     Findings: No  erythema or rash.  Neurological:     Mental Status: He is alert and oriented to person, place, and time.     Cranial Nerves: No cranial nerve deficit.     Deep Tendon Reflexes: Reflexes are normal and symmetric.  Psychiatric:        Behavior: Behavior normal.        Thought Content: Thought content normal.        Judgment: Judgment normal.      BP 117/72   Pulse 74   Temp 98.4 F (36.9 C) (Temporal)   Ht '5\' 11"'$  (1.803 m)   Wt 187 lb 9.6 oz (85.1 kg)   SpO2 96%   BMI 26.16 kg/m      Assessment & Plan:  Jonathan Washington comes in today with chief complaint of Fall (Thursday hurt right hand /)   Diagnosis and orders addressed:  1. Right hand pain - DG Hand Complete Right; Future - diclofenac (VOLTAREN) 75 MG EC tablet; Take 1 tablet (75 mg total) by mouth 2 (two) times daily.  Dispense: 30 tablet; Refill: 0  2. Contusion of right hand, initial encounter  - diclofenac (VOLTAREN) 75 MG EC tablet; Take 1 tablet (75 mg  total) by mouth 2 (two) times daily.  Dispense: 30 tablet; Refill: 0  Rest Ice  Diclofenac BID, no other NSAIDs   Evelina Dun, FNP

## 2020-12-19 ENCOUNTER — Other Ambulatory Visit: Payer: Self-pay | Admitting: Family

## 2020-12-19 DIAGNOSIS — S62396A Other fracture of fifth metacarpal bone, right hand, initial encounter for closed fracture: Secondary | ICD-10-CM

## 2020-12-20 ENCOUNTER — Telehealth: Payer: Self-pay | Admitting: Family

## 2020-12-20 ENCOUNTER — Other Ambulatory Visit: Payer: Self-pay | Admitting: *Deleted

## 2020-12-21 ENCOUNTER — Other Ambulatory Visit: Payer: Self-pay

## 2020-12-21 ENCOUNTER — Ambulatory Visit (INDEPENDENT_AMBULATORY_CARE_PROVIDER_SITE_OTHER): Payer: Medicare Other | Admitting: Orthopaedic Surgery

## 2020-12-21 ENCOUNTER — Encounter: Payer: Self-pay | Admitting: Orthopaedic Surgery

## 2020-12-21 DIAGNOSIS — S62356A Nondisplaced fracture of shaft of fifth metacarpal bone, right hand, initial encounter for closed fracture: Secondary | ICD-10-CM | POA: Diagnosis not present

## 2020-12-21 DIAGNOSIS — S62309A Unspecified fracture of unspecified metacarpal bone, initial encounter for closed fracture: Secondary | ICD-10-CM | POA: Insufficient documentation

## 2020-12-21 NOTE — Telephone Encounter (Signed)
Can we verify that he is taking losartan? Not on med list.

## 2020-12-21 NOTE — Progress Notes (Signed)
Office Visit Note   Patient: Jonathan Washington           Date of Birth: 04-01-1956           MRN: [id number] Visit Date: 12/21/2020              Requested by: Sharion Balloon, [address] [place],  [place] [id number] PCP: Sharion Balloon, FNP   Assessment & Plan: Visit Diagnoses:  1. Closed nondisplaced fracture of shaft of fifth metacarpal bone of right hand, initial encounter     Plan: 1 week ago Jonathan Washington relates that he was falling backwards and placed his right arm behind him to prevent the fall.  He immediate onset of pain in his right hand.  Subsequent x-rays demonstrated nondisplaced fracture of the little metacarpal.  He has been taping the 2 fingers together and returns today for initial orthopedic evaluation.  I reviewed the films and agree with the fracture.  It is nondisplaced.  I have placed him in a ulnar gutter splint and we will plan to see him back in the next 2 to 2-1/2 weeks.  Long discussion regarding activity modification.  We will need new films when he returns I did review the films on the PACS system and there is a nondisplaced fracture of the midshaft of the little metacarpal right hand.  There was also some widening of the scapholunate joint but there is no tenderness in that area.  In addition there is nonpainful lucency within the triquetrum which is old  Follow-Up Instructions: Return in about 2 weeks (around 01/04/2021).   Orders:  No orders of the defined types were placed in this encounter.  No orders of the defined types were placed in this encounter.     Procedures: No procedures performed   Clinical Data: No additional findings.   Subjective: Chief Complaint  Patient presents with   Right Hand - Pain  Patient presents today for right hand pain. He states that he fell backwards a week ago and landed on a piece of equipment and his right hand. He had immediate pain and swelling. He saw his PCP and had x-rays taken this week. He  was told that he had fractured his fifth metacarpal. He is taking Diclofenac.  No related numbness or tingling but still having some swelling in the dorsum of his right hand.  No wrist pain  HPI  Review of Systems   Objective: Vital Signs: There were no vitals taken for this visit.  Physical Exam Constitutional:      Appearance: He is well-developed.  Eyes:     Pupils: Pupils are equal, round, and reactive to light.  Pulmonary:     Effort: Pulmonary effort is normal.  Skin:    General: Skin is warm and dry.  Neurological:     Mental Status: He is alert and oriented to person, place, and time.  Psychiatric:        Behavior: Behavior normal.    Ortho Exam awake alert and oriented x3.  Comfortable sitting.  Right hand evaluated with some mild swelling dorsally mostly along the ulnar aspect.  There was some tenderness along the fifth metacarpal.  No deformity of the fingers.  Neurologically intact.  Good capillary refill to the fingers.  No wrist pain.  Specialty Comments:  No specialty comments available.  Imaging: No results found.   PMFS History: Patient Active Problem List   Diagnosis Date Noted   Metacarpal bone fracture  12/21/2020   Diabetes mellitus ([place]) 07/27/2020   Gastroesophageal reflux disease 07/27/2020   Hyperlipidemia associated with type 2 diabetes mellitus ([place]) 07/27/2020   H/O prostate cancer 07/27/2020   Arthritis of carpometacarpal (CMC) joint of thumb 12/01/2019   Vestibular hypofunction of left ear 12/17/2018   Malignant neoplasm of prostate ([place]) 08/11/2018   Sensorineural hearing loss (SNHL), bilateral 01/06/2018   Vertigo 01/06/2018   Past Medical History:  Diagnosis Date   Cataract    Dyslipidemia    Fatty liver    Fibromyalgia    GERD (gastroesophageal reflux disease)    Glaucoma    RIGHT EYE   Glucose intolerance (impaired glucose tolerance)    Hypertension    Pneumonia    4 YRS AGO   Prostate cancer (HCC)    Vertigo     ESPECIALLY IF PT TURNS HIS HEAD OR MOVES A CERTAIN WAY    Family History  Problem Relation Age of Onset   Breast cancer Mother    Bladder Cancer Father    Throat cancer Father    Lung cancer Paternal Aunt        non smoker    Past Surgical History:  Procedure Laterality Date   CHOLECYSTECTOMY     EYE SURGERY Bilateral    cataracts   LEFT KNEE ARTHROSCOPY     LYMPHADENECTOMY  05/27/2012   Procedure: LYMPHADENECTOMY;  Surgeon: Bernestine Amass, MD;  Location: [facility];  Service: Urology;  Laterality: Bilateral;   ROBOT ASSISTED LAPAROSCOPIC RADICAL PROSTATECTOMY  05/27/2012   Procedure: ROBOTIC ASSISTED LAPAROSCOPIC RADICAL PROSTATECTOMY;  Surgeon: Bernestine Amass, MD;  Location: [facility];  Service: Urology;  Laterality: N/A;   SURGERY FOR DEVIATED NASAL SEPTUM     Social History   Occupational History    Comment: retired since 2015  Tobacco Use   Smoking status: Former    Packs/day: 2.00    Years: 15.00    Pack years: 30.00    Types: Cigarettes    Quit date: 04/22/1978    Years since quitting: 42.6   Smokeless tobacco: Never   Tobacco comments:    Alafaya  Vaping Use   Vaping Use: Never used  Substance and Sexual Activity   Alcohol use: Yes    Comment: 1 0R 2 BEERS DAILY   Drug use: No   Sexual activity: Yes    Comment: with aid of Viagra

## 2020-12-21 NOTE — Telephone Encounter (Signed)
Ok

## 2020-12-21 NOTE — Telephone Encounter (Signed)
Lmtcb.

## 2020-12-22 NOTE — Telephone Encounter (Signed)
He is said Dr. Rex Kras had started him on it years ago

## 2020-12-26 ENCOUNTER — Encounter: Payer: Self-pay | Admitting: Orthopaedic Surgery

## 2020-12-26 ENCOUNTER — Ambulatory Visit (INDEPENDENT_AMBULATORY_CARE_PROVIDER_SITE_OTHER): Payer: Medicare Other

## 2020-12-26 ENCOUNTER — Other Ambulatory Visit: Payer: Self-pay

## 2020-12-26 ENCOUNTER — Ambulatory Visit (INDEPENDENT_AMBULATORY_CARE_PROVIDER_SITE_OTHER): Payer: Medicare Other | Admitting: Orthopaedic Surgery

## 2020-12-26 ENCOUNTER — Telehealth: Payer: Self-pay | Admitting: Orthopaedic Surgery

## 2020-12-26 DIAGNOSIS — S62356A Nondisplaced fracture of shaft of fifth metacarpal bone, right hand, initial encounter for closed fracture: Secondary | ICD-10-CM | POA: Diagnosis not present

## 2020-12-26 MED ORDER — LOSARTAN POTASSIUM 100 MG PO TABS: 100.0000 mg | ORAL_TABLET | Freq: Every day | ORAL | 1 refills | Status: DC

## 2020-12-26 NOTE — Telephone Encounter (Signed)
Pt wife called and states her husband is in a lot of pain with his right wrist. His wife has an appt at 1:30 and would like to know if they both can see him? If not she was wondering if he could come in later this week to be seen before they go out of town?   CB [phone]

## 2020-12-26 NOTE — Telephone Encounter (Signed)
Scheduled appointment for this afternoon.

## 2020-12-26 NOTE — Progress Notes (Signed)
Office Visit Note   Patient: Jonathan Washington           Date of Birth: [birth year]           MRN: [id number] Visit Date: 12/26/2020              Requested by: Sharion Balloon, [address] [place],  [place] [address] PCP: Sharion Balloon, FNP   Assessment & Plan: Visit Diagnoses:  1. Closed nondisplaced fracture of shaft of fifth metacarpal bone of right hand, initial encounter     Plan: Mr. Jonathan Washington relates that he sustained another minor injury to his right hand over the weekend while he was not wearing the splint.  Films of the right hand demonstrate very minimal displacement of the short oblique fracture of the mid shaft of the little metacarpal.  Clinically there is no malalignment or rotation.  Urged him to wear the splint and avoid any activity.  He and his wife are going to [employer] this week with the family and then when he returns to repeat come back to the office for follow-up films.  Follow-Up Instructions: Return As scheduled.   Orders:  Orders Placed This Encounter  Procedures   XR Hand Complete Right   No orders of the defined types were placed in this encounter.     Procedures: No procedures performed   Clinical Data: No additional findings.   Subjective: Chief Complaint  Patient presents with   Right Hip - Follow-up    5th metacarpal fracture  Patient presents today for a 5 day follow up on his right hand. He has a fifth metacarpal fracture. He states that he continues to have pain and swelling. He wanted to come back in and make sure this was normal. He is very painful at his pinky finger. He states that he has no grip in his hand. He is wearing his brace as directed.  HPI  Review of Systems   Objective: Vital Signs: There were no vitals taken for this visit.  Physical Exam  Ortho Exam right hand looks minimally swollen in the area of the little metacarpal.  No erythema or ecchymosis.  Notes he has a little decreased  sensibility and has since he had the injury.  No pain over the ulnar nerve.  No malalignment.  I could carefully and passively place the little finger almost to the palm of the hand without malrotation.  Normal ulnar and radial pulses and good capillary refill to the fingers  Specialty Comments:  No specialty comments available.  Imaging: XR Hand Complete Right  Result Date: 12/26/2020 New films of the right hand demonstrate minimal displacement of the short oblique fracture of the midshaft of the little metacarpal.  There is a distinct difference between the films today and the films from last week.  The lateral film is nondisplaced.  There did not appear to be any malrotation clinically so I we will plan to see him back in the next 10 to 14 days and repeat films encouraged him to wear the splint    PMFS History: Patient Active Problem List   Diagnosis Date Noted   Metacarpal bone fracture 12/21/2020   Diabetes mellitus ([place]) 07/27/2020   Gastroesophageal reflux disease 07/27/2020   Hyperlipidemia associated with type 2 diabetes mellitus ([place]) 07/27/2020   H/O prostate cancer 07/27/2020   Arthritis of carpometacarpal [facility]) joint of thumb 12/01/2019   Vestibular hypofunction of left ear 12/17/2018   Malignant neoplasm of  prostate ([place]) 08/11/2018   Sensorineural hearing loss (SNHL), bilateral 01/06/2018   Vertigo 01/06/2018   Past Medical History:  Diagnosis Date   Cataract    Dyslipidemia    Fatty liver    Fibromyalgia    GERD (gastroesophageal reflux disease)    Glaucoma    RIGHT EYE   Glucose intolerance (impaired glucose tolerance)    Hypertension    Pneumonia    4 YRS AGO   Prostate cancer ([place])    Vertigo    ESPECIALLY IF PT TURNS HIS HEAD OR MOVES A CERTAIN WAY    Family History  Problem Relation Age of Onset   Breast cancer Mother    Bladder Cancer Father    Throat cancer Father    Lung cancer Paternal Aunt        non smoker    Past Surgical History:   Procedure Laterality Date   CHOLECYSTECTOMY     EYE SURGERY Bilateral    cataracts   LEFT KNEE ARTHROSCOPY     LYMPHADENECTOMY  05/27/2012   Procedure: LYMPHADENECTOMY;  Surgeon: Bernestine Amass, MD;  Location: [facility];  Service: Urology;  Laterality: Bilateral;   ROBOT ASSISTED LAPAROSCOPIC RADICAL PROSTATECTOMY  05/27/2012   Procedure: ROBOTIC ASSISTED LAPAROSCOPIC RADICAL PROSTATECTOMY;  Surgeon: Bernestine Amass, MD;  Location: [facility];  Service: Urology;  Laterality: N/A;   SURGERY FOR DEVIATED NASAL SEPTUM     Social History   Occupational History    Comment: retired since 2015  Tobacco Use   Smoking status: Former    Packs/day: 2.00    Years: 15.00    Pack years: 30.00    Types: Cigarettes    Quit date: 04/22/1978    Years since quitting: 42.7   Smokeless tobacco: Never   Tobacco comments:    QUIT SMOKING 1980  Vaping Use   Vaping Use: Never used  Substance and Sexual Activity   Alcohol use: Yes    Comment: 1 0R 2 BEERS DAILY   Drug use: No   Sexual activity: Yes    Comment: with aid of Viagra

## 2020-12-27 NOTE — Telephone Encounter (Signed)
LMOVM refil for Losartan was sent into [facility]

## 2021-01-09 ENCOUNTER — Ambulatory Visit (INDEPENDENT_AMBULATORY_CARE_PROVIDER_SITE_OTHER): Payer: Medicare Other

## 2021-01-09 ENCOUNTER — Ambulatory Visit (INDEPENDENT_AMBULATORY_CARE_PROVIDER_SITE_OTHER): Payer: Medicare Other | Admitting: Orthopaedic Surgery

## 2021-01-09 ENCOUNTER — Encounter: Payer: Self-pay | Admitting: Orthopaedic Surgery

## 2021-01-09 ENCOUNTER — Other Ambulatory Visit: Payer: Self-pay

## 2021-01-09 DIAGNOSIS — S62356A Nondisplaced fracture of shaft of fifth metacarpal bone, right hand, initial encounter for closed fracture: Secondary | ICD-10-CM

## 2021-01-09 NOTE — Progress Notes (Signed)
Office Visit Note   Patient: Jonathan Washington           Date of Birth: [birth year]           MRN: [id number] Visit Date: 01/09/2021              Requested by: Sharion Balloon, [address] [place],  [place] [address] PCP: Sharion Balloon, FNP   Assessment & Plan: Visit Diagnoses:  1. Closed nondisplaced fracture of shaft of fifth metacarpal bone of right hand, initial encounter     Plan: Mr. Scherzer is approximately 1 month status post injury to his right hand when he sustained a short oblique fracture of the little metacarpal.  When he was here last visit there was some slight displacement.  Today's films reveal no change from the films and were previously performed.  There is some early callus.  Clinically there is no abnormality or deformity of the little finger and he can make a full fist although it is a little bit tight.  Neurologically intact and does not have any pain over the fracture site.  Will urge him to work with range of motion exercises and just wear the splint if he is going to be very active.  Like to check him again in 2 weeks.  No new films will be necessary  Follow-Up Instructions: Return in about 2 weeks (around 01/23/2021).   Orders:  Orders Placed This Encounter  Procedures   XR Hand Complete Right   No orders of the defined types were placed in this encounter.     Procedures: No procedures performed   Clinical Data: No additional findings.   Subjective: Chief Complaint  Patient presents with   Right Hand - Follow-up    Fifth metacarpal fracture f/u  Patient presents today for a two week follow up on his right hand. He is about 4 weeks out from a fifth metacarpal fracture. He said that his swelling and pain has went down a lot. He is still wearing the brace and states that it causes stiffness.   HPI  Review of Systems   Objective: Vital Signs: There were no vitals taken for this visit.  Physical Exam  Ortho Exam right hand  was minimally swollen in the area of the little metacarpal.  No pain.  No obvious deformity.  Comparing the left  to the right little finger there was no difference.  With the right hand he can make a full fist with the little finger.  Neurologically intact  Specialty Comments:  No specialty comments available.  Imaging: XR Hand Complete Right  Result Date: 01/09/2021 Films of the right hand were obtained in 3 projections.  The previously identified spiral of the oblique fracture of the little metacarpal demonstrates some early callus and no change in position since his last visit.  There is no displacement on the lateral view and is probably a millimeter or 2 short but clinically there did not appear to be any difference to the left hand    PMFS History: Patient Active Problem List   Diagnosis Date Noted   Metacarpal bone fracture 12/21/2020   Diabetes mellitus ([place]) 07/27/2020   Gastroesophageal reflux disease 07/27/2020   Hyperlipidemia associated with type 2 diabetes mellitus ([place]) 07/27/2020   H/O prostate cancer 07/27/2020   Arthritis of carpometacarpal [facility]) joint of thumb 12/01/2019   Vestibular hypofunction of left ear 12/17/2018   Malignant neoplasm of prostate ([place]) 08/11/2018   Sensorineural hearing  loss (SNHL), bilateral 01/06/2018   Vertigo 01/06/2018   Past Medical History:  Diagnosis Date   Cataract    Dyslipidemia    Fatty liver    Fibromyalgia    GERD (gastroesophageal reflux disease)    Glaucoma    RIGHT EYE   Glucose intolerance (impaired glucose tolerance)    Hypertension    Pneumonia    4 YRS AGO   Prostate cancer ([facility])    Vertigo    ESPECIALLY IF PT TURNS HIS HEAD OR MOVES A CERTAIN WAY    Family History  Problem Relation Age of Onset   Breast cancer Mother    Bladder Cancer Father    Throat cancer Father    Lung cancer Paternal Aunt        non smoker    Past Surgical History:  Procedure Laterality Date   CHOLECYSTECTOMY     EYE SURGERY  Bilateral    cataracts   LEFT KNEE ARTHROSCOPY     LYMPHADENECTOMY  05/27/2012   Procedure: LYMPHADENECTOMY;  Surgeon: Bernestine Amass, MD;  Location[facility];  Service: Urology;  Laterality: Bilateral;   ROBOT ASSISTED LAPAROSCOPIC RADICAL PROSTATECTOMY  05/27/2012   Procedure: ROBOTIC ASSISTED LAPAROSCOPIC RADICAL PROSTATECTOMY;  Surgeon: Bernestine Amass, MD;  Location[facility];  Service: Urology;  Laterality: N/A;   SURGERY FOR DEVIATED NASAL SEPTUM     Social History   Occupational History    Comment: retired since 2015  Tobacco Use   Smoking status: Former    Packs/day: 2.00    Years: 15.00    Pack years: 30.00    Types: Cigarettes    Quit date: 04/22/1978    Years since quitting: 42.7   Smokeless tobacco: Never   Tobacco comments:    QUIT SMOKING 1980  Vaping Use   Vaping Use: Never used  Substance and Sexual Activity   Alcohol use: Yes    Comment: 1 0R 2 BEERS DAILY   Drug use: No   Sexual activity: Yes    Comment: with aid of Viagra

## 2021-01-25 ENCOUNTER — Ambulatory Visit (INDEPENDENT_AMBULATORY_CARE_PROVIDER_SITE_OTHER): Payer: Medicare Other | Admitting: Orthopaedic Surgery

## 2021-01-25 ENCOUNTER — Encounter: Payer: Self-pay | Admitting: Orthopaedic Surgery

## 2021-01-25 ENCOUNTER — Other Ambulatory Visit: Payer: Self-pay

## 2021-01-25 DIAGNOSIS — S62356D Nondisplaced fracture of shaft of fifth metacarpal bone, right hand, subsequent encounter for fracture with routine healing: Secondary | ICD-10-CM

## 2021-01-25 NOTE — Progress Notes (Signed)
Office Visit Note   Patient: Jonathan Washington           Date of Birth: 1955-09-22           MRN: [id number] Visit Date: 01/25/2021              Requested by: Sharion Balloon, [address] [place],  [place] [address] PCP: Sharion Balloon, FNP   Assessment & Plan: Visit Diagnoses:  1. Closed nondisplaced fracture of shaft of fifth metacarpal bone of right hand with routine healing, subsequent encounter     Plan: Mr. Simien relates that he no longer has any problems with his right hand and, specifically, no discomfort about the fifth metacarpal.  He has full range of motion of his hand with good grip.  No new films today.  There is no tenderness or obvious deformity of the little finger related to the fracture.  We will plan to see him back as needed.  Activity as tolerated  Follow-Up Instructions: Return if symptoms worsen or fail to improve.   Orders:  No orders of the defined types were placed in this encounter.  No orders of the defined types were placed in this encounter.     Procedures: No procedures performed   Clinical Data: No additional findings.   Subjective: Chief Complaint  Patient presents with   Right Hand - Follow-up    Fifth metacarpal fracture  Patient presents today for a two week follow up on his right hand. He is now a month and half out from a fifth metacarpal fracture. He is doing well. He said that his hand does feel stiff.   HPI  Review of Systems   Objective: Vital Signs: There were no vitals taken for this visit.  Physical Exam  Ortho Exam right hand with asymptomatic Dupuytren's involving predominate the little finger I can just about fully extend the finger across the metacarpal phalangeal joint but he does have a lot of thickened tissue related to the Dupuytren's.  There is no deformity with flexion extension in regards to to the metacarpal fracture.  There is no discomfort over the midshaft of the fifth metacarpal.   There is no swelling or erythema.  Specialty Comments:  No specialty comments available.  Imaging: No results found.   PMFS History: Patient Active Problem List   Diagnosis Date Noted   Metacarpal bone fracture 12/21/2020   Diabetes mellitus ([place]) 07/27/2020   Gastroesophageal reflux disease 07/27/2020   Hyperlipidemia associated with type 2 diabetes mellitus ([place]) 07/27/2020   H/O prostate cancer 07/27/2020   Arthritis of carpometacarpal ([facility]) joint of thumb 12/01/2019   Vestibular hypofunction of left ear 12/17/2018   Malignant neoplasm of prostate ([place]) 08/11/2018   Sensorineural hearing loss (SNHL), bilateral 01/06/2018   Vertigo 01/06/2018   Past Medical History:  Diagnosis Date   Cataract    Dyslipidemia    Fatty liver    Fibromyalgia    GERD (gastroesophageal reflux disease)    Glaucoma    RIGHT EYE   Glucose intolerance (impaired glucose tolerance)    Hypertension    Pneumonia    4 YRS AGO   Prostate cancer ([facility])    Vertigo    ESPECIALLY IF PT TURNS HIS HEAD OR MOVES A CERTAIN WAY    Family History  Problem Relation Age of Onset   Breast cancer Mother    Bladder Cancer Father    Throat cancer Father    Lung cancer Paternal Aunt  non smoker    Past Surgical History:  Procedure Laterality Date   CHOLECYSTECTOMY     EYE SURGERY Bilateral    cataracts   LEFT KNEE ARTHROSCOPY     LYMPHADENECTOMY  05/27/2012   Procedure: LYMPHADENECTOMY;  Surgeon: Bernestine Amass, MD;  [facility];  Service: Urology;  Laterality: Bilateral;   ROBOT ASSISTED LAPAROSCOPIC RADICAL PROSTATECTOMY  05/27/2012   Procedure: ROBOTIC ASSISTED LAPAROSCOPIC RADICAL PROSTATECTOMY;  Surgeon: Bernestine Amass, MD;  Location[facility];  Service: Urology;  Laterality: N/A;   SURGERY FOR DEVIATED NASAL SEPTUM     Social History   Occupational History    Comment: retired since 2015  Tobacco Use   Smoking status: Former    Packs/day: 2.00    Years: 15.00    Pack years: 30.00     Types: Cigarettes    Quit date: 04/22/1978    Years since quitting: 42.7   Smokeless tobacco: Never   Tobacco comments:    QUIT SMOKING 1980  Vaping Use   Vaping Use: Never used  Substance and Sexual Activity   Alcohol use: Yes    Comment: 1 0R 2 BEERS DAILY   Drug use: No   Sexual activity: Yes    Comment: with aid of Viagra

## 2021-01-26 ENCOUNTER — Ambulatory Visit (INDEPENDENT_AMBULATORY_CARE_PROVIDER_SITE_OTHER): Payer: Medicare Other | Admitting: Family

## 2021-01-26 ENCOUNTER — Encounter: Payer: Self-pay | Admitting: Family

## 2021-01-26 VITALS — BP 120/72 | HR 96 | Temp 98.2°F | Ht 71.0 in | Wt 193.0 lb

## 2021-01-26 DIAGNOSIS — Z1159 Encounter for screening for other viral diseases: Secondary | ICD-10-CM

## 2021-01-26 DIAGNOSIS — E1169 Type 2 diabetes mellitus with other specified complication: Secondary | ICD-10-CM

## 2021-01-26 DIAGNOSIS — E785 Hyperlipidemia, unspecified: Secondary | ICD-10-CM

## 2021-01-26 DIAGNOSIS — M1810 Unilateral primary osteoarthritis of first carpometacarpal joint, unspecified hand: Secondary | ICD-10-CM

## 2021-01-26 DIAGNOSIS — K219 Gastro-esophageal reflux disease without esophagitis: Secondary | ICD-10-CM

## 2021-01-26 DIAGNOSIS — Z794 Long term (current) use of insulin: Secondary | ICD-10-CM

## 2021-01-26 DIAGNOSIS — Z8546 Personal history of malignant neoplasm of prostate: Secondary | ICD-10-CM

## 2021-01-26 NOTE — Patient Instructions (Signed)
Health Maintenance, Male Adopting a healthy lifestyle and getting preventive care are important in promoting health and wellness. Ask your health care provider about: The right schedule for you to have regular tests and exams. Things you can do on your own to prevent diseases and keep yourself healthy. What should I know about diet, weight, and exercise? Eat a healthy diet  Eat a diet that includes plenty of vegetables, fruits, low-fat dairy products, and lean protein. Do not eat a lot of foods that are high in solid fats, added sugars, or sodium. Maintain a healthy weight Body mass index (BMI) is a measurement that can be used to identify possible weight problems. It estimates body fat based on height and weight. Your health care provider can help determine your BMI and help you achieve or maintain a healthy weight. Get regular exercise Get regular exercise. This is one of the most important things you can do for your health. Most adults should: Exercise for at least 150 minutes each week. The exercise should increase your heart rate and make you sweat (moderate-intensity exercise). Do strengthening exercises at least twice a week. This is in addition to the moderate-intensity exercise. Spend less time sitting. Even light physical activity can be beneficial. Watch cholesterol and blood lipids Have your blood tested for lipids and cholesterol at 67 years of age, then have this test every 5 years. You may need to have your cholesterol levels checked more often if: Your lipid or cholesterol levels are high. You are older than 67 years of age. You are at high risk for heart disease. What should I know about cancer screening? Many types of cancers can be detected early and may often be prevented. Depending on your health history and family history, you may need to have cancer screening at various ages. This may include screening for: Colorectal cancer. Prostate cancer. Skin cancer. Lung  cancer. What should I know about heart disease, diabetes, and high blood pressure? Blood pressure and heart disease High blood pressure causes heart disease and increases the risk of stroke. This is more likely to develop in people who have high blood pressure readings, are of African descent, or are overweight. Talk with your health care provider about your target blood pressure readings. Have your blood pressure checked: Every 3-5 years if you are 18-39 years of age. Every year if you are 40 years old or older. If you are between the ages of 65 and 75 and are a current or former smoker, ask your health care provider if you should have a one-time screening for abdominal aortic aneurysm (AAA). Diabetes Have regular diabetes screenings. This checks your fasting blood sugar level. Have the screening done: Once every three years after age 45 if you are at a normal weight and have a low risk for diabetes. More often and at a younger age if you are overweight or have a high risk for diabetes. What should I know about preventing infection? Hepatitis B If you have a higher risk for hepatitis B, you should be screened for this virus. Talk with your health care provider to find out if you are at risk for hepatitis B infection. Hepatitis C Blood testing is recommended for: Everyone born from 1945 through 1965. Anyone with known risk factors for hepatitis C. Sexually transmitted infections (STIs) You should be screened each year for STIs, including gonorrhea and chlamydia, if: You are sexually active and are younger than 67 years of age. You are older than 67 years   of age and your health care provider tells you that you are at risk for this type of infection. Your sexual activity has changed since you were last screened, and you are at increased risk for chlamydia or gonorrhea. Ask your health care provider if you are at risk. Ask your health care provider about whether you are at high risk for HIV.  Your health care provider may recommend a prescription medicine to help prevent HIV infection. If you choose to take medicine to prevent HIV, you should first get tested for HIV. You should then be tested every 3 months for as long as you are taking the medicine. Follow these instructions at home: Lifestyle Do not use any products that contain nicotine or tobacco, such as cigarettes, e-cigarettes, and chewing tobacco. If you need help quitting, ask your health care provider. Do not use street drugs. Do not share needles. Ask your health care provider for help if you need support or information about quitting drugs. Alcohol use Do not drink alcohol if your health care provider tells you not to drink. If you drink alcohol: Limit how much you have to 0-2 drinks a day. Be aware of how much alcohol is in your drink. In the [place] one drink equals one 12 oz bottle of beer (355 mL), one 5 oz glass of wine (148 mL), or one 1 oz glass of hard liquor (44 mL). General instructions Schedule regular health, dental, and eye exams. Stay current with your vaccines. Tell your health care provider if: You often feel depressed. You have ever been abused or do not feel safe at home. Summary Adopting a healthy lifestyle and getting preventive care are important in promoting health and wellness. Follow your health care provider's instructions about healthy diet, exercising, and getting tested or screened for diseases. Follow your health care provider's instructions on monitoring your cholesterol and blood pressure. This information is not intended to replace advice given to you by your health care provider. Make sure you discuss any questions you have with your health care provider. Document Revised: 06/16/2020 Document Reviewed: 04/01/2018 Elsevier Patient Education  2022 [employer]  

## 2021-01-26 NOTE — Progress Notes (Signed)
Subjective:    Patient ID: Jonathan Washington, male    DOB: May 07, 1955, 65 y.o.   MRN: [id number]  Chief Complaint  Patient presents with   Medical Management of Chronic Issues   Pt presents to the office today to establish care. He is followed by Urologists every 6 months for hx of Prostate Cancer 2014. He is followed by Endo every 6 months for DM. His last A1C was 6.0 that was drawn in March. He states he does exercising class twice a week for 45 mins.  Hypertension This is a chronic problem. The current episode started more than 1 year ago. The problem has been resolved since onset. The problem is controlled. Pertinent negatives include no blurred vision, malaise/fatigue, peripheral edema or shortness of breath. Risk factors for coronary artery disease include dyslipidemia, diabetes mellitus, obesity and male gender. The current treatment provides moderate improvement.  Gastroesophageal Reflux He complains of belching and heartburn. This is a chronic problem. The current episode started more than 1 year ago. The problem occurs occasionally. The problem has been waxing and waning. He has tried a PPI for the symptoms. The treatment provided moderate relief.  Hyperlipidemia This is a chronic problem. The current episode started more than 1 year ago. Pertinent negatives include no shortness of breath. Current antihyperlipidemic treatment includes statins. The current treatment provides moderate improvement of lipids.  Diabetes He presents for his follow-up diabetic visit. He has type 2 diabetes mellitus. Pertinent negatives for diabetes include no blurred vision. Diabetic complications include nephropathy. Risk factors for coronary artery disease include dyslipidemia, diabetes mellitus, male sex, hypertension and sedentary lifestyle. His overall blood glucose range is 90-110 mg/dl.  Arthritis Presents for follow-up visit. He complains of pain and stiffness. The symptoms have been stable. Affected  locations include the left knee, right knee, left MCP and right MCP. His pain is at a severity of 1/10.     Review of Systems  Constitutional:  Negative for malaise/fatigue.  Eyes:  Negative for blurred vision.  Respiratory:  Negative for shortness of breath.   Gastrointestinal:  Positive for heartburn.  Musculoskeletal:  Positive for arthritis and stiffness.  All other systems reviewed and are negative.     Objective:   Physical Exam Vitals reviewed.  Constitutional:      General: He is not in acute distress.    Appearance: He is well-developed.  HENT:     Head: Normocephalic.     Right Ear: Tympanic membrane normal.     Left Ear: Tympanic membrane normal.  Eyes:     General:        Right eye: No discharge.        Left eye: No discharge.     Pupils: Pupils are equal, round, and reactive to light.  Neck:     Thyroid: No thyromegaly.  Cardiovascular:     Rate and Rhythm: Normal rate and regular rhythm.     Heart sounds: Normal heart sounds. No murmur heard. Pulmonary:     Effort: Pulmonary effort is normal. No respiratory distress.     Breath sounds: Normal breath sounds. No wheezing.  Abdominal:     General: Bowel sounds are normal. There is no distension.     Palpations: Abdomen is soft.     Tenderness: There is no abdominal tenderness.  Musculoskeletal:        General: No tenderness. Normal range of motion.     Cervical back: Normal range of motion and neck supple.  Skin:  General: Skin is warm and dry.     Findings: No erythema or rash.  Neurological:     Mental Status: He is alert and oriented to person, place, and time.     Cranial Nerves: No cranial nerve deficit.     Deep Tendon Reflexes: Reflexes are normal and symmetric.  Psychiatric:        Behavior: Behavior normal.        Thought Content: Thought content normal.        Judgment: Judgment normal.      BP 120/72   Pulse 96   Temp 98.2 F (36.8 C) (Temporal)   Ht _0  (1.803 m)   Wt 193 lb  (87.5 kg)   SpO2 96%   BMI 26.92 kg/m      Assessment & Plan:  Jonathan Washington comes in today with chief complaint of Medical Management of Chronic Issues   Diagnosis and orders addressed:  1. Gastroesophageal reflux disease, unspecified whether esophagitis present - CMP14+EGFR - CBC with Differential/Platelet  2. Hyperlipidemia associated with type 2 diabetes mellitus (HCC) - CMP14+EGFR - CBC with Differential/Platelet  3. Type 2 diabetes mellitus with other specified complication, with long-term current use of insulin (HCC) - CMP14+EGFR - CBC with Differential/Platelet  4. H/O prostate cancer - CMP14+EGFR - CBC with Differential/Platelet  5. Arthritis of carpometacarpal (CMC) joint of thumb - CMP14+EGFR - CBC with Differential/Platelet  6. Need for hepatitis C screening test - Hepatitis C antibody - CBC with Differential/Platelet   Labs pending Health Maintenance reviewed Diet and exercise encouraged  Follow up plan:  6 months    Evelina Dun, FNP

## 2021-01-27 LAB — HEPATITIS C ANTIBODY: Hep C Virus Ab: <0.1 s/co ratio (ref 0.0–0.9)

## 2021-01-27 LAB — CMP14+EGFR
ALT: 25 IU/L (ref 0–44)
AST: 24 IU/L (ref 0–40)
Albumin/Globulin Ratio: 2 (ref 1.2–2.2)
Albumin: 4.5 g/dL (ref 3.8–4.8)
Alkaline Phosphatase: 76 IU/L (ref 44–121)
BUN/Creatinine Ratio: 18 (ref 10–24)
BUN: 20 mg/dL (ref 8–27)
Bilirubin Total: 0.4 mg/dL (ref 0.0–1.2)
CO2: 21 mmol/L (ref 20–29)
Calcium: 9.2 mg/dL (ref 8.6–10.2)
Chloride: 103 mmol/L (ref 96–106)
Creatinine, Ser: 1.09 mg/dL (ref 0.76–1.27)
Globulin, Total: 2.3 g/dL (ref 1.5–4.5)
Glucose: 151 mg/dL — ABNORMAL HIGH (ref 70–99)
Potassium: 4.3 mmol/L (ref 3.5–5.2)
Sodium: 138 mmol/L (ref 134–144)
Total Protein: 6.8 g/dL (ref 6.0–8.5)
eGFR: 74 mL/min/{1.73_m2} (ref >59)

## 2021-01-27 LAB — CBC WITH DIFFERENTIAL/PLATELET
Basophils Absolute: 0.1 10*3/uL (ref 0.0–0.2)
Basos: 2 %
EOS (ABSOLUTE): 0.6 10*3/uL — ABNORMAL HIGH (ref 0.0–0.4)
Eos: 12 %
Hematocrit: 45.8 % (ref 37.5–51.0)
Hemoglobin: 15.3 g/dL (ref 13.0–17.7)
Immature Grans (Abs): 0 10*3/uL (ref 0.0–0.1)
Immature Granulocytes: 0 %
Lymphocytes Absolute: 1.1 10*3/uL (ref 0.7–3.1)
Lymphs: 24 %
MCH: 31.5 pg (ref 26.6–33.0)
MCHC: 33.4 g/dL (ref 31.5–35.7)
MCV: 94 fL (ref 79–97)
Monocytes Absolute: 0.5 10*3/uL (ref 0.1–0.9)
Monocytes: 10 %
Neutrophils Absolute: 2.3 10*3/uL (ref 1.4–7.0)
Neutrophils: 52 %
Platelets: 217 10*3/uL (ref 150–450)
RBC: 4.86 x10E6/uL (ref 4.14–5.80)
RDW: 13 % (ref 11.6–15.4)
WBC: 4.6 10*3/uL (ref 3.4–10.8)

## 2021-01-29 ENCOUNTER — Other Ambulatory Visit: Payer: Self-pay | Admitting: Family Medicine

## 2021-01-29 DIAGNOSIS — E1169 Type 2 diabetes mellitus with other specified complication: Secondary | ICD-10-CM

## 2021-01-30 ENCOUNTER — Other Ambulatory Visit: Payer: Self-pay

## 2021-01-30 ENCOUNTER — Other Ambulatory Visit: Payer: Medicare Other

## 2021-01-30 DIAGNOSIS — E1169 Type 2 diabetes mellitus with other specified complication: Secondary | ICD-10-CM

## 2021-01-30 DIAGNOSIS — E785 Hyperlipidemia, unspecified: Secondary | ICD-10-CM

## 2021-01-31 LAB — LIPID PANEL
Chol/HDL Ratio: 4.6 ratio (ref 0.0–5.0)
Cholesterol, Total: 96 mg/dL — ABNORMAL LOW (ref 100–199)
HDL: 21 mg/dL — ABNORMAL LOW (ref >39)
LDL Chol Calc (NIH): 45 mg/dL (ref 0–99)
Triglycerides: 178 mg/dL — ABNORMAL HIGH (ref 0–149)
VLDL Cholesterol Cal: 30 mg/dL (ref 5–40)

## 2021-02-15 IMAGING — PT NUCLEAR MEDICINE  NOPR SKULL BASE TO THIGH
1 of 7 series · 3 of 16 positions shown, 4 images · non-contrast
Comparison: None.

CLINICAL DATA: Prostate carcinoma with biochemical recurrence. T2
N1. Gleason 3 + 3 adenocarcinoma. Radical prostatectomy.

EXAM:
NUCLEAR MEDICINE PET SKULL BASE TO THIGH
TECHNIQUE: 10.2 mCi F-18 Fluciclovine was injected intravenously. Full-ring PET
imaging was performed from the skull base to thigh after the
radiotracer. CT data was obtained and used for attenuation
correction and anatomic localization.

[Series 4: ct sk_thigh 5.0 hd_fov · axial · 1.17mm/px · z∈[+4,+984]mm · 3 of 246 slices shown, 4 images]
[im 1/246  soft-tissue]
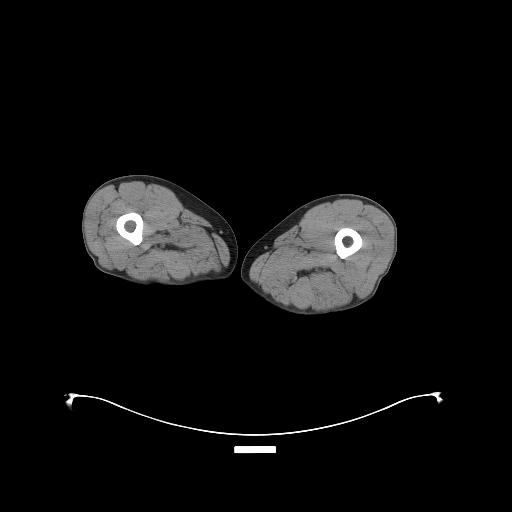
[im 1/246  bone]
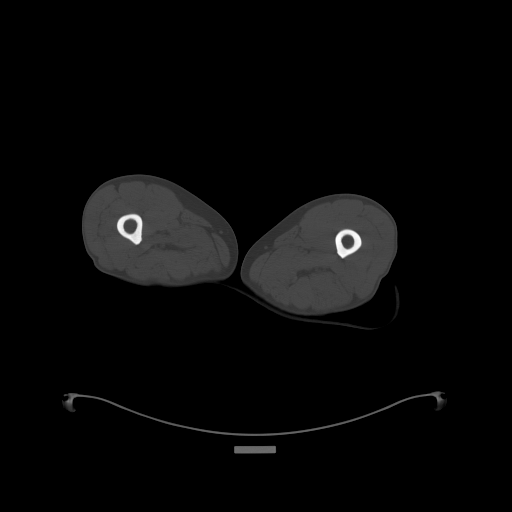
[im 123/246  soft-tissue]
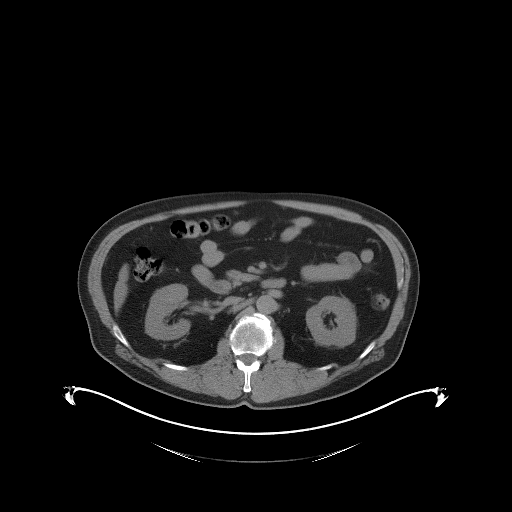
[im 246/246  soft-tissue]
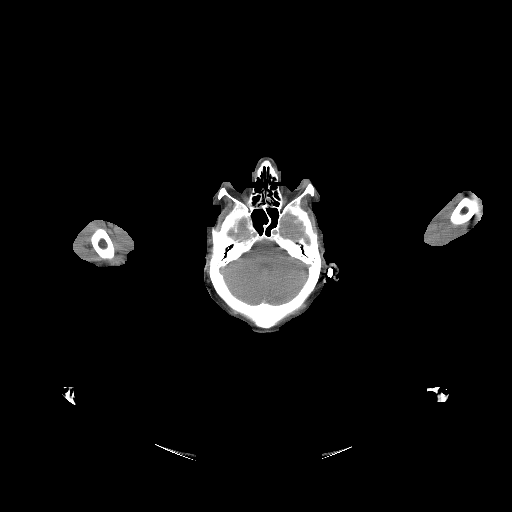

[3 of 16 positions shown; findings below may reference images not displayed]

FINDINGS: NECK

No radiotracer activity in neck lymph nodes.

Incidental CT finding: None

CHEST

No radiotracer accumulation within mediastinal or hilar lymph nodes.
No suspicious pulmonary nodules on the CT scan.

Incidental CT finding: None

ABDOMEN/PELVIS

Prostate: No focal activity prostatectomy bed.

Lymph nodes: No abnormal radiotracer accumulation within pelvic or
abdominal nodes.

Liver: No evidence of liver metastasis

Incidental CT finding: Mild hepatic steatosis.  Postcholecystectomy

SKELETON

No focal  activity to suggest skeletal metastasis.
IMPRESSION: 1. No evidence of local recurrence in the prostatectomy bed.
2. No evidence of metastatic lymphadenopathy.
3. No soft tissue metastasis or skeletal metastasis

## 2021-03-08 ENCOUNTER — Other Ambulatory Visit: Payer: Self-pay | Admitting: *Deleted

## 2021-03-09 ENCOUNTER — Telehealth: Payer: Self-pay | Admitting: Family

## 2021-03-09 MED ORDER — FENOFIBRATE 160 MG PO TABS: 160.0000 mg | ORAL_TABLET | Freq: Every day | ORAL | 1 refills | Status: DC

## 2021-03-09 NOTE — Telephone Encounter (Signed)
Refill sent to pharmacy.   

## 2021-03-09 NOTE — Telephone Encounter (Signed)
  Prescription Request  03/09/2021  Is this a "Controlled Substance" medicine? No  Have you seen your PCP in the last 2 weeks? no  If YES, route message to pool  -  If NO, patient needs to be scheduled for appointment.  What is the name of the medication or equipment? Finofibrate?  Have you contacted your pharmacy to request a refill? yes   Which pharmacy would you like this sent to? [facility]   Patient notified that their request is being sent to the clinical staff for review and that they should receive a response within 2 business days.    Jonathan Washington' pt.  He has been trying for 10 days now to get this filled & he has been out for a few days now. Please send today!  Please call pt.

## 2021-03-22 ENCOUNTER — Ambulatory Visit: Payer: Medicare Other

## 2021-03-29 ENCOUNTER — Ambulatory Visit (INDEPENDENT_AMBULATORY_CARE_PROVIDER_SITE_OTHER): Payer: Medicare Other

## 2021-03-29 DIAGNOSIS — Z0000 Encounter for general adult medical examination without abnormal findings: Secondary | ICD-10-CM | POA: Diagnosis not present

## 2021-03-29 NOTE — Progress Notes (Signed)
MEDICARE ANNUAL WELLNESS VISIT  03/29/2021  Telephone Visit Disclaimer This Medicare AWV was conducted by telephone due to national recommendations for restrictions regarding the COVID-19 Pandemic (e.g. social distancing).  I verified, using two identifiers, that I am speaking with Jonathan Washington or their authorized healthcare agent. I discussed the limitations, risks, security, and privacy concerns of performing an evaluation and management service by telephone and the potential availability of an in-person appointment in the future. The patient expressed understanding and agreed to proceed.  Location of Patient: Home Location of Provider (nurse):  [facility]  Subjective:    Jonathan Washington is a 65 y.o. male patient of Hawks, Jonathan Hawthorne, FNP who had a Medicare Annual Wellness Visit today via telephone. Jonathan Washington is Retired and lives with their spouse. He has two sons and six grandchildren. He reports that he is socially active and does interact with friends/family regularly. He is minimally physically active and enjoys hunting and working in his yard.  Patient Care Team: Sharion Balloon, FNP as PCP - General (Family Medicine) Cira Rue, RN Nurse Navigator as Registered Nurse ([place]) DeRidder, P.A.  Advanced Directives 03/29/2021 12/17/2018 12/17/2018 09/01/2018 05/27/2012 05/18/2012  Does Patient Have a Medical Advance Directive? No Yes No No Patient has advance directive, copy not in chart Patient has advance directive, copy not in chart  Type of Advance Directive - Living will - - [place];Living will [place];Living will  Copy of [place] in Chart? - - - - Copy requested from family Copy requested from family  Would patient like information on creating a medical advance directive? No - Patient declined - - No - Patient declined - -  Pre-existing out of facility DNR order (yellow form or pink MOST  form) - - - - No -    Hospital Utilization Over the Past 12 Months: # of hospitalizations or ER visits: 0 # of surgeries: 0  Review of Systems    Patient reports that his overall health is unchanged compared to last year.  History obtained from chart review and the patient  Patient Reported Readings (BP, Pulse, CBG, Weight, etc) none  Pain Assessment Pain : No/denies pain     Current Medications & Allergies (verified) Allergies as of 03/29/2021   No Known Allergies      Medication List        Accurate as of March 29, 2021 10:32 AM. If you have any questions, ask your nurse or doctor.          STOP taking these medications    diclofenac 75 MG EC tablet Commonly known as: VOLTAREN       TAKE these medications    amLODipine 2.5 MG tablet Commonly known as: NORVASC TAKE 1 TABLET BY MOUTH ONCE DAILY FOR 30 DAYS   aspirin 81 MG tablet Take 81 mg by mouth.   fenofibrate 160 MG tablet Take 1 tablet (160 mg total) by mouth daily.   FreeStyle Libre 2 Sensor Misc USE AS DIRECTED EVERY  14  DAYS   imipramine 10 MG tablet Commonly known as: TOFRANIL Take 1 tablet (10 mg total) by mouth at bedtime.   losartan 100 MG tablet Commonly known as: COZAAR Take 1 tablet (100 mg total) by mouth daily.   metFORMIN 500 MG 24 hr tablet Commonly known as: GLUCOPHAGE-XR Take 500 mg by mouth 2 (two) times daily.   omeprazole 20 MG capsule Commonly known as:  PRILOSEC Take 20 mg by mouth daily as needed. For heartburn   rosuvastatin 5 MG tablet Commonly known as: CRESTOR Take 5 mg by mouth daily.   sildenafil 100 MG tablet Commonly known as: VIAGRA Take 100 mg by mouth daily as needed for erectile dysfunction.   timolol 0.5 % ophthalmic solution Commonly known as: TIMOPTIC Place 1 drop into the right eye 2 (two) times daily.        History (reviewed): Past Medical History:  Diagnosis Date   Cataract    Dyslipidemia    Fatty liver    Fibromyalgia     GERD (gastroesophageal reflux disease)    Glaucoma    RIGHT EYE   Glucose intolerance (impaired glucose tolerance)    Hypertension    Pneumonia    4 YRS AGO   Prostate cancer ([place])    Vertigo    ESPECIALLY IF PT TURNS HIS HEAD OR MOVES A CERTAIN WAY   Past Surgical History:  Procedure Laterality Date   CHOLECYSTECTOMY     EYE SURGERY Bilateral    cataracts   LEFT KNEE ARTHROSCOPY     LYMPHADENECTOMY  05/27/2012   Procedure: LYMPHADENECTOMY;  Surgeon: Bernestine Amass, MD;  Location: [facility];  Service: Urology;  Laterality: Bilateral;   ROBOT ASSISTED LAPAROSCOPIC RADICAL PROSTATECTOMY  05/27/2012   Procedure: ROBOTIC ASSISTED LAPAROSCOPIC RADICAL PROSTATECTOMY;  Surgeon: Bernestine Amass, MD;  Location: [facility];  Service: Urology;  Laterality: N/A;   SURGERY FOR DEVIATED NASAL SEPTUM     Family History  Problem Relation Age of Onset   Breast cancer Mother    Bladder Cancer Father    Throat cancer Father    Lung cancer Paternal Aunt        non smoker   Social History   Socioeconomic History   Marital status: Married    Spouse name: Not on file   Number of children: 2   Years of education: Not on file   Highest education level: Not on file  Occupational History    Comment: retired since 2015  Tobacco Use   Smoking status: Former    Packs/day: 2.00    Years: 15.00    Pack years: 30.00    Types: Cigarettes    Quit date: 04/22/1978    Years since quitting: 42.9   Smokeless tobacco: Never   Tobacco comments:    [place]  Vaping Use   Vaping Use: Never used  Substance and Sexual Activity   Alcohol use: Yes    Comment: 1 0R 2 BEERS DAILY   Drug use: No   Sexual activity: Yes    Comment: with aid of Viagra  Other Topics Concern   Not on file  Social History Narrative   Not on file   Social Determinants of Health   Financial Resource Strain: Not on file  Food Insecurity: Not on file  Transportation Needs: Not on file  Physical Activity: Not on file  Stress:  Not on file  Social Connections: Not on file    Activities of Daily Living In your present state of health, do you have any difficulty performing the following activities: 03/29/2021  Hearing? N  Vision? N  Difficulty concentrating or making decisions? N  Walking or climbing stairs? Y  Dressing or bathing? N  Doing errands, shopping? N  Preparing Food and eating ? N  Using the Toilet? N  In the past six months, have you accidently leaked urine? N  Do you have  problems with loss of bowel control? N  Managing your Medications? N  Managing your Finances? N  Housekeeping or managing your Housekeeping? N  Some recent data might be hidden    Patient Education/ Literacy How often do you need to have someone help you when you read instructions, pamphlets, or other written materials from your doctor or pharmacy?: 1 - Never  Exercise Current Exercise Habits: Structured exercise class, Type of exercise: strength training/weights;stretching;walking, Time (Minutes): 60, Frequency (Times/Week): 2, Weekly Exercise (Minutes/Week): 120, Intensity: Mild, Exercise limited by: None identified  Diet Patient reports consuming 3 meals a day and 1 snack(s) a day Patient reports that his primary diet is: Diabetic Patient reports that he does have regular access to food.   Depression Screen PHQ 2/9 Scores 03/29/2021 12/18/2020 07/27/2020 05/15/2017  PHQ - 2 Score 0 0 0 0  PHQ- 9 Score - 1 - -     Fall Risk Fall Risk  03/29/2021 12/18/2020 07/27/2020 05/15/2017  Falls in the past year? 1 1 1  No  Number falls in past yr: 1 0 1 -  Injury with Fall? 1 1 0 -  Risk for fall due to : History of fall(s) No Fall Risks - -  Follow up Falls evaluation completed Education provided - -     Objective:  Jonathan Washington seemed alert and oriented and he participated appropriately during our telephone visit.  Blood Pressure Weight BMI  BP Readings from Last 3 Encounters:  01/26/21 120/72  12/18/20 117/72   07/27/20 105/63   Wt Readings from Last 3 Encounters:  01/26/21 193 lb (87.5 kg)  12/18/20 187 lb 9.6 oz (85.1 kg)  07/27/20 190 lb 3.2 oz (86.3 kg)   BMI Readings from Last 1 Encounters:  01/26/21 26.92 kg/m    *Unable to obtain current vital signs, weight, and BMI due to telephone visit type  Hearing/Vision  Ruthann Cancer did not seem to have difficulty with hearing/understanding during the telephone conversation Reports that he has had a formal eye exam by an eye care professional within the past year Reports that he has not had a formal hearing evaluation within the past year *Unable to fully assess hearing and vision during telephone visit type  Cognitive Function: 6CIT Screen 03/29/2021  What Year? 0 points  What time? 0 points  Count back from 20 0 points  Months in reverse 0 points  Repeat phrase 0 points   (Normal:0-7, Significant for Dysfunction: >8)  Normal Cognitive Function Screening: Yes   Immunization & Health Maintenance Record Immunization History  Administered Date(s) Administered   Influenza Split 04/09/2010, 03/21/2014   Influenza, High Dose Seasonal PF 02/17/2019, 03/09/2020   Influenza,inj,Quad PF,6-35 Mos 01/22/2018   Influenza,inj,quad, With Preservative 01/07/2019   PFIZER(Purple Top)SARS-COV-2 Vaccination 05/18/2019, 06/08/2019, 01/15/2020   Pneumococcal Polysaccharide-23 06/21/2008, 11/24/2014, 02/17/2019   Td 07/22/2001, 01/22/2018   Tdap 10/22/2010   Zoster Recombinat (Shingrix) 02/17/2019   Zoster, Live 11/05/2013    Health Maintenance  Topic Date Due   Zoster Vaccines- Shingrix (2 of 2) 04/14/2019   Pneumonia Vaccine 55+ Years old (3 - PCV) 02/17/2020   COVID-19 Vaccine (4 - Booster for [place] series) 03/11/2020   OPHTHALMOLOGY EXAM  11/14/2020   HEMOGLOBIN A1C  12/24/2020   INFLUENZA VACCINE  07/20/2021 (Originally 11/20/2020)   FOOT EXAM  07/27/2021   COLONOSCOPY (Pts 45-22yrs Insurance coverage will need to be confirmed)  07/04/2024    TETANUS/TDAP  01/23/2028   Hepatitis C Screening  Completed   HPV VACCINES  Aged Out       Assessment  This is a routine wellness examination for [employer].  Health Maintenance: Due or Overdue Health Maintenance Due  Topic Date Due   Zoster Vaccines- Shingrix (2 of 2) 04/14/2019   Pneumonia Vaccine 36+ Years old (3 - PCV) 02/17/2020   COVID-19 Vaccine (4 - Booster for Pfizer series) 03/11/2020   OPHTHALMOLOGY EXAM  11/14/2020   HEMOGLOBIN A1C  12/24/2020    Jonathan Washington does not need a referral for Community Assistance: Care Management:   no Social Work:    no Prescription Assistance:  no Nutrition/Diabetes Education:  no   Plan:  Personalized Goals  Goals Addressed             This Visit's Progress    Patient Stated       03/29/2021 AWV Goal: Fall Prevention  Over the next year, patient will decrease their risk for falls by: Using assistive devices, such as a cane or walker, as needed Identifying fall risks within their home and correcting them by: Removing throw rugs Adding handrails to stairs or ramps Removing clutter and keeping a clear pathway throughout the home Increasing light, especially at night Adding shower handles/bars Raising toilet seat Identifying potential personal risk factors for falls: Medication side effects Incontinence/urgency Vestibular dysfunction Hearing loss Musculoskeletal disorders Neurological disorders Orthostatic hypotension         Personalized Health Maintenance & Screening Recommendations  Pneumococcal vaccine   Lung Cancer Screening Recommended: no (Low Dose CT Chest recommended if Age 54-80 years, 30 pack-year currently smoking OR have quit w/in past 15 years) Hepatitis C Screening recommended: no HIV Screening recommended: no  Advanced Directives: Written information was not prepared per patient's request.  Referrals & Orders No orders of the defined types were placed in this  encounter.   Follow-up Plan Follow-up with Sharion Balloon, FNP as planned    I have personally reviewed and noted the following in the patient's chart:   Medical and social history Use of alcohol, tobacco or illicit drugs  Current medications and supplements Functional ability and status Nutritional status Physical activity Advanced directives List of other physicians Hospitalizations, surgeries, and ER visits in previous 12 months Vitals Screenings to include cognitive, depression, and falls Referrals and appointments  In addition, I have reviewed and discussed with Jonathan Washington certain preventive protocols, quality metrics, and best practice recommendations. A written personalized care plan for preventive services as well as general preventive health recommendations is available and can be mailed to the patient at his request.      Felicity Coyer, LPN    [id number]

## 2021-04-05 ENCOUNTER — Other Ambulatory Visit: Payer: Self-pay | Admitting: Family

## 2021-04-05 NOTE — Telephone Encounter (Signed)
°  Prescription Request  04/05/2021  Is this a "Controlled Substance" medicine? No  Have you seen your PCP in the last 2 weeks? No   If YES, route message to pool  -  If NO, patient needs to be scheduled for appointment.  What is the name of the medication or equipment? Rosuvastatin 5mg   Have you contacted your pharmacy to request a refill? no   Which pharmacy would you like this sent to? [facility]   Patient notified that their request is being sent to the clinical staff for review and that they should receive a response within 2 business days.   Jonathan Washington' pt.  Other doctor will not fill meds any more.  He was told to call his PCP to get filled.  Please call wife.

## 2021-04-09 MED ORDER — ROSUVASTATIN CALCIUM 5 MG PO TABS: 5.0000 mg | ORAL_TABLET | Freq: Every day | ORAL | 1 refills | Status: DC

## 2021-04-09 NOTE — Telephone Encounter (Signed)
Aware refill sent to pharmacy ?

## 2021-04-10 ENCOUNTER — Telehealth: Payer: Self-pay | Admitting: Family

## 2021-04-11 ENCOUNTER — Ambulatory Visit (INDEPENDENT_AMBULATORY_CARE_PROVIDER_SITE_OTHER): Payer: Medicare Other | Admitting: Family Medicine

## 2021-04-11 ENCOUNTER — Encounter: Payer: Self-pay | Admitting: Family Medicine

## 2021-04-11 DIAGNOSIS — U071 COVID-19: Secondary | ICD-10-CM

## 2021-04-11 MED ORDER — MOLNUPIRAVIR EUA 200MG CAPSULE: 4.0000 | ORAL_CAPSULE | Freq: Two times a day (BID) | ORAL | 0 refills | Status: AC

## 2021-04-11 NOTE — Progress Notes (Signed)
Virtual Visit via telephone note Due to COVID-19 pandemic this visit was conducted virtually. This visit type was conducted due to national recommendations for restrictions regarding the COVID-19 Pandemic (e.g. social distancing, sheltering in place) in an effort to limit this patient's exposure and mitigate transmission in our community. All issues noted in this document were discussed and addressed.  A physical exam was not performed with this format.   I connected with Jonathan Washington on 04/11/2021 at 0830 by telephone and verified that I am speaking with the correct person using two identifiers. Jonathan Washington is currently located at home and family is currently with them during visit. The provider, Monia Pouch, FNP is located in their office at time of visit.  I discussed the limitations, risks, security and privacy concerns of performing an evaluation and management service by virtual visit and the availability of in person appointments. I also discussed with the patient that there may be a patient responsible charge related to this service. The patient expressed understanding and agreed to proceed.  Subjective:  Patient ID: Jonathan Washington, male    DOB: October 05, 1955, 65 y.o.   MRN: [id number]  Chief Complaint:  Covid Positive   HPI: Jonathan Washington is a 65 y.o. male presenting on 04/11/2021 for Covid Positive   Patient reports scratchy throat, cough, congestion, fever, chills, and malaise.  States onset of symptoms yesterday with scratchy throat.  States woke up this morning with progressive symptoms.  Wife was tested for COVID yesterday and was positive.  Patient took a home test this morning which was positive.  He has taken some Tylenol with no relief of symptoms.  URI  This is a new problem. The current episode started yesterday. Associated symptoms include congestion, coughing, headaches, rhinorrhea and a sore throat. Pertinent negatives include no abdominal pain,  chest pain, diarrhea, dysuria, ear pain, joint pain, joint swelling, nausea, neck pain, plugged ear sensation, rash, sinus pain, sneezing, swollen glands, vomiting or wheezing. He has tried acetaminophen for the symptoms.    Relevant past medical, surgical, family, and social history reviewed and updated as indicated.  Allergies and medications reviewed and updated.   Past Medical History:  Diagnosis Date   Cataract    Dyslipidemia    Fatty liver    Fibromyalgia    GERD (gastroesophageal reflux disease)    Glaucoma    RIGHT EYE   Glucose intolerance (impaired glucose tolerance)    Hypertension    Pneumonia    4 YRS AGO   Prostate cancer ([place])    Vertigo    ESPECIALLY IF PT TURNS HIS HEAD OR MOVES A CERTAIN WAY    Past Surgical History:  Procedure Laterality Date   CHOLECYSTECTOMY     EYE SURGERY Bilateral    cataracts   LEFT KNEE ARTHROSCOPY     LYMPHADENECTOMY  05/27/2012   Procedure: LYMPHADENECTOMY;  Surgeon: Bernestine Amass, MD;  Location: [facility];  Service: Urology;  Laterality: Bilateral;   ROBOT ASSISTED LAPAROSCOPIC RADICAL PROSTATECTOMY  05/27/2012   Procedure: ROBOTIC ASSISTED LAPAROSCOPIC RADICAL PROSTATECTOMY;  Surgeon: Bernestine Amass, MD;  Location: [facility];  Service: Urology;  Laterality: N/A;   SURGERY FOR DEVIATED NASAL SEPTUM      Social History   Socioeconomic History   Marital status: Married    Spouse name: Not on file   Number of children: 2   Years of education: Not on file   Highest education level: Not on file  Occupational History  Comment: retired since 2015  Tobacco Use   Smoking status: Former    Packs/day: 2.00    Years: 15.00    Pack years: 30.00    Types: Cigarettes    Quit date: 04/22/1978    Years since quitting: 43.0   Smokeless tobacco: Never   Tobacco comments:    [place]  Vaping Use   Vaping Use: Never used  Substance and Sexual Activity   Alcohol use: Yes    Comment: 1 0R 2 BEERS DAILY   Drug use: No   Sexual  activity: Yes    Comment: with aid of Viagra  Other Topics Concern   Not on file  Social History Narrative   Not on file   Social Determinants of Health   Financial Resource Strain: Not on file  Food Insecurity: Not on file  Transportation Needs: Not on file  Physical Activity: Not on file  Stress: Not on file  Social Connections: Not on file  Intimate Partner Violence: Not on file    Outpatient Encounter Medications as of 04/11/2021  Medication Sig   molnupiravir EUA (LAGEVRIO) 200 mg CAPS capsule Take 4 capsules (800 mg total) by mouth 2 (two) times daily for 5 days.   amLODipine (NORVASC) 2.5 MG tablet TAKE 1 TABLET BY MOUTH ONCE DAILY FOR 30 DAYS   aspirin 81 MG tablet Take 81 mg by mouth.   Continuous Blood Gluc Sensor (FREESTYLE LIBRE 2 SENSOR) MISC USE AS DIRECTED EVERY  14  DAYS   fenofibrate 160 MG tablet Take 1 tablet (160 mg total) by mouth daily.   imipramine (TOFRANIL) 10 MG tablet Take 1 tablet (10 mg total) by mouth at bedtime.   losartan (COZAAR) 100 MG tablet Take 1 tablet (100 mg total) by mouth daily.   metFORMIN (GLUCOPHAGE-XR) 500 MG 24 hr tablet Take 500 mg by mouth 2 (two) times daily.   omeprazole (PRILOSEC) 20 MG capsule Take 20 mg by mouth daily as needed. For heartburn   rosuvastatin (CRESTOR) 5 MG tablet Take 1 tablet (5 mg total) by mouth daily.   sildenafil (VIAGRA) 100 MG tablet Take 100 mg by mouth daily as needed for erectile dysfunction.   timolol (TIMOPTIC) 0.5 % ophthalmic solution Place 1 drop into the right eye 2 (two) times daily.   No facility-administered encounter medications on file as of 04/11/2021.    No Known Allergies  Review of Systems  Constitutional:  Positive for activity change, chills, fatigue and fever. Negative for appetite change, diaphoresis and unexpected weight change.  HENT:  Positive for congestion, postnasal drip, rhinorrhea and sore throat. Negative for dental problem, drooling, ear discharge, ear pain, facial  swelling, hearing loss, mouth sores, nosebleeds, sinus pressure, sinus pain, sneezing, tinnitus and trouble swallowing.   Respiratory:  Positive for cough. Negative for shortness of breath and wheezing.   Cardiovascular:  Negative for chest pain.  Gastrointestinal:  Negative for abdominal pain, diarrhea, nausea and vomiting.  Genitourinary:  Negative for decreased urine volume, difficulty urinating and dysuria.  Musculoskeletal:  Negative for joint pain and neck pain.  Skin:  Negative for rash.  Neurological:  Positive for headaches. Negative for dizziness, tremors, seizures, syncope, facial asymmetry, speech difficulty, weakness, light-headedness and numbness.  Psychiatric/Behavioral:  Negative for confusion.   All other systems reviewed and are negative.       Observations/Objective: No vital signs or physical exam, this was a virtual health encounter.  Pt alert and oriented, answers all questions appropriately, and able  to speak in full sentences.    Assessment and Plan: Jonathan Washington was seen today for covid positive.  Diagnoses and all orders for this visit:  Positive self-administered antigen test for COVID-19 COVID positive at home.  Will initiate antiviral therapy due to age and comorbidities.  Symptomatic care discussed in detail.  Adequate hydration, Tylenol as needed for fever and pain, Mucinex for cough and congestion.  Patient aware to report any new, worsening, or persistent symptoms.  Follow-up as needed. -     molnupiravir EUA (LAGEVRIO) 200 mg CAPS capsule; Take 4 capsules (800 mg total) by mouth 2 (two) times daily for 5 days.     Follow Up Instructions: Return if symptoms worsen or fail to improve.    I discussed the assessment and treatment plan with the patient. The patient was provided an opportunity to ask questions and all were answered. The patient agreed with the plan and demonstrated an understanding of the instructions.   The patient was advised to call  back or seek an in-person evaluation if the symptoms worsen or if the condition fails to improve as anticipated.  The above assessment and management plan was discussed with the patient. The patient verbalized understanding of and has agreed to the management plan. Patient is aware to call the clinic if they develop any new symptoms or if symptoms persist or worsen. Patient is aware when to return to the clinic for a follow-up visit. Patient educated on when it is appropriate to go to the emergency department.    I provided 15 minutes of time during this telephone encounter.   Monia Pouch, FNP-C [facility] [address] [place], [place] [address] [phone] 04/11/2021

## 2021-05-13 ENCOUNTER — Other Ambulatory Visit: Payer: Self-pay | Admitting: Family

## 2021-05-15 LAB — HM DIABETES EYE EXAM

## 2021-05-20 ENCOUNTER — Other Ambulatory Visit: Payer: Self-pay | Admitting: Family

## 2021-06-21 ENCOUNTER — Other Ambulatory Visit: Payer: Self-pay | Admitting: Family

## 2021-07-03 ENCOUNTER — Emergency Department (HOSPITAL_COMMUNITY)
Admission: EM | Admit: 2021-07-03 | Discharge: 2021-07-03 | Disposition: A | Discharge status: Home / Self Care | Payer: Medicare Other | Attending: Emergency Medicine | Admitting: Emergency Medicine

## 2021-07-03 ENCOUNTER — Encounter (HOSPITAL_COMMUNITY): Payer: Self-pay

## 2021-07-03 ENCOUNTER — Other Ambulatory Visit: Payer: Self-pay

## 2021-07-03 ENCOUNTER — Emergency Department (HOSPITAL_COMMUNITY): Payer: Medicare Other

## 2021-07-03 DIAGNOSIS — Z7982 Long term (current) use of aspirin: Secondary | ICD-10-CM | POA: Insufficient documentation

## 2021-07-03 DIAGNOSIS — R112 Nausea with vomiting, unspecified: Secondary | ICD-10-CM | POA: Diagnosis present

## 2021-07-03 DIAGNOSIS — R197 Diarrhea, unspecified: Secondary | ICD-10-CM | POA: Diagnosis present

## 2021-07-03 DIAGNOSIS — R000 Tachycardia, unspecified: Secondary | ICD-10-CM | POA: Insufficient documentation

## 2021-07-03 DIAGNOSIS — Z20822 Contact with and (suspected) exposure to covid-19: Secondary | ICD-10-CM | POA: Diagnosis not present

## 2021-07-03 DIAGNOSIS — K529 Noninfective gastroenteritis and colitis, unspecified: Secondary | ICD-10-CM | POA: Diagnosis not present

## 2021-07-03 LAB — COMPREHENSIVE METABOLIC PANEL
ALT: 30 U/L (ref 0–44)
AST: 24 U/L (ref 15–41)
Albumin: 4.5 g/dL (ref 3.5–5.0)
Alkaline Phosphatase: 57 U/L (ref 38–126)
Anion gap: 9 (ref 5–15)
BUN: 21 mg/dL (ref 8–23)
CO2: 26 mmol/L (ref 22–32)
Calcium: 9.2 mg/dL (ref 8.9–10.3)
Chloride: 106 mmol/L (ref 98–111)
Creatinine, Ser: 1.07 mg/dL (ref 0.61–1.24)
GFR, Estimated: >60 mL/min (ref >60)
Glucose, Bld: 162 mg/dL — ABNORMAL HIGH (ref 70–99)
Potassium: 3.8 mmol/L (ref 3.5–5.1)
Sodium: 141 mmol/L (ref 135–145)
Total Bilirubin: 1.3 mg/dL — ABNORMAL HIGH (ref 0.3–1.2)
Total Protein: 7.5 g/dL (ref 6.5–8.1)

## 2021-07-03 LAB — CBC
HCT: 52.9 % — ABNORMAL HIGH (ref 39.0–52.0)
Hemoglobin: 18.4 g/dL — ABNORMAL HIGH (ref 13.0–17.0)
MCH: 31.6 pg (ref 26.0–34.0)
MCHC: 34.8 g/dL (ref 30.0–36.0)
MCV: 90.9 fL (ref 80.0–100.0)
Platelets: 238 10*3/uL (ref 150–400)
RBC: 5.82 MIL/uL — ABNORMAL HIGH (ref 4.22–5.81)
RDW: 13 % (ref 11.5–15.5)
WBC: 7.5 10*3/uL (ref 4.0–10.5)
nRBC: 0 % (ref 0.0–0.2)

## 2021-07-03 LAB — URINALYSIS, ROUTINE W REFLEX MICROSCOPIC
Bilirubin Urine: NEGATIVE
Glucose, UA: NEGATIVE mg/dL
Ketones, ur: NEGATIVE mg/dL
Leukocytes,Ua: NEGATIVE
Nitrite: NEGATIVE
Protein, ur: NEGATIVE mg/dL
Specific Gravity, Urine: 1.026 (ref 1.005–1.030)
pH: 5 (ref 5.0–8.0)

## 2021-07-03 LAB — C DIFFICILE QUICK SCREEN W PCR REFLEX
C Diff antigen: NEGATIVE
C Diff interpretation: NOT DETECTED
C Diff toxin: NEGATIVE

## 2021-07-03 LAB — LIPASE, BLOOD: Lipase: 30 U/L (ref 11–51)

## 2021-07-03 MED ORDER — LACTATED RINGERS IV BOLUS
1000.0000 mL | Freq: Once | INTRAVENOUS | Status: AC
  Administered 2021-07-03: 1000 mL via INTRAVENOUS

## 2021-07-03 MED ORDER — ONDANSETRON 4 MG PO TBDP: 4.0000 mg | ORAL_TABLET | Freq: Three times a day (TID) | ORAL | 0 refills | Status: DC | PRN

## 2021-07-03 MED ORDER — BISMUTH SUBSALICYLATE 262 MG PO CHEW
524.0000 mg | CHEWABLE_TABLET | Freq: Once | ORAL | Status: AC
  Administered 2021-07-03: 524 mg via ORAL
  Filled 2021-07-03: qty 2

## 2021-07-03 MED ORDER — BISMUTH SUBSALICYLATE 262 MG PO CHEW: 524.0000 mg | CHEWABLE_TABLET | Freq: Once | ORAL | Status: DC

## 2021-07-03 MED ORDER — IOHEXOL 300 MG/ML  SOLN
100.0000 mL | Freq: Once | INTRAMUSCULAR | Status: AC | PRN
Start: 2021-07-03 — End: 2021-07-03
  Administered 2021-07-03: 100 mL via INTRAVENOUS

## 2021-07-03 MED ORDER — ONDANSETRON HCL 4 MG/2ML IJ SOLN
4.0000 mg | Freq: Once | INTRAMUSCULAR | Status: AC
  Administered 2021-07-03: 4 mg via INTRAVENOUS
  Filled 2021-07-03: qty 2

## 2021-07-03 NOTE — ED Provider Notes (Signed)
?[facility] ?Provider Note ? ? ?CSN: [id number] ?Arrival date & time: 07/03/21  1543 ? ?  ? ?History ? ?Chief Complaint  ?Patient presents with  ? Emesis  ? ? ?Jonathan Washington is a 66 y.o. male. ? ?HPI ?66 year old male presents with vomiting.  Last week he started having diarrhea which was particularly bad 1 week ago and then seemed to progressively improved throughout the week and over the weekend he was doing okay.  However the diarrhea became bad again last night and then today he vomited.  When he saw his PCP he vomited again after palpation of his abdomen.  He has been having some bloating but no significant abdominal pain.  No hematemesis or bloody stools.  No fevers until he got here when his temperature was 100.  He denies any recent travel or antibiotic use.  No other sick contacts. Unable to keep down food/medicine. ? ?Home Medications ?Prior to Admission medications   ?Medication Sig Start Date End Date Taking? Authorizing Provider  ?ondansetron (ZOFRAN-ODT) 4 MG disintegrating tablet Take 1 tablet (4 mg total) by mouth every 8 (eight) hours as needed for nausea or vomiting. 07/03/21  Yes Sherwood Gambler, MD  ?amLODipine (NORVASC) 2.5 MG tablet Take 1 tablet by mouth once daily 05/21/21   Evelina Dun A, FNP  ?aspirin 81 MG tablet Take 81 mg by mouth.    [provider]  ?Continuous Blood Gluc Sensor (FREESTYLE LIBRE 2 SENSOR) MISC USE AS DIRECTED EVERY  14  DAYS    [provider]  ?fenofibrate 160 MG tablet Take 1 tablet (160 mg total) by mouth daily. 03/09/21   Sharion Balloon, FNP  ?imipramine (TOFRANIL) 10 MG tablet TAKE 1 TABLET BY MOUTH AT BEDTIME 05/14/21   Sharion Balloon, FNP  ?losartan (COZAAR) 100 MG tablet Take 1 tablet by mouth once daily 06/22/21   Sharion Balloon, FNP  ?metFORMIN (GLUCOPHAGE-XR) 500 MG 24 hr tablet Take 500 mg by mouth 2 (two) times daily.    [provider]  ?omeprazole (PRILOSEC) 20 MG capsule Take 20  mg by mouth daily as needed. For heartburn    [provider]  ?rosuvastatin (CRESTOR) 5 MG tablet Take 1 tablet (5 mg total) by mouth daily. 04/09/21   Sharion Balloon, FNP  ?sildenafil (VIAGRA) 100 MG tablet Take 100 mg by mouth daily as needed for erectile dysfunction.    [provider]  ?timolol (TIMOPTIC) 0.5 % ophthalmic solution Place 1 drop into the right eye 2 (two) times daily.    [provider]  ?   ? ?Allergies    ?Patient has no known allergies.   ? ?Review of Systems   ?Review of Systems  ?Constitutional:  Negative for fever.  ?Respiratory:  Negative for cough and shortness of breath.   ?Gastrointestinal:  Positive for diarrhea, nausea and vomiting. Negative for abdominal pain and blood in stool.  ? ?Physical Exam ?Updated Vital Signs ?BP 117/82   Pulse (!) 103   Temp 100 ?F (37.8 ?C) (Oral)   Resp 18   Ht '5\' 11"'$  (1.803 m)   Wt 87.5 kg   SpO2 94%   BMI 26.90 kg/m?  ?Physical Exam ?Vitals and nursing note reviewed.  ?Constitutional:   ?   General: He is not in acute distress. ?   Appearance: He is well-developed. He is not ill-appearing or diaphoretic.  ?HENT:  ?   Head: Normocephalic and atraumatic.  ?Cardiovascular:  ?  Rate and Rhythm: Regular rhythm. Tachycardia present.  ?   Heart sounds: Normal heart sounds.  ?Pulmonary:  ?   Effort: Pulmonary effort is normal.  ?   Breath sounds: Normal breath sounds.  ?Abdominal:  ?   Palpations: Abdomen is soft.  ?   Tenderness: There is abdominal tenderness (mild, intermittent and hard to localize).  ?Skin: ?   General: Skin is warm and dry.  ?Neurological:  ?   Mental Status: He is alert.  ? ? ?ED Results / Procedures / Treatments   ?Labs ?(all labs ordered are listed, but only abnormal results are displayed) ?Labs Reviewed  ?COMPREHENSIVE METABOLIC PANEL - Abnormal; Notable for the following components:  ?    Result Value  ? Glucose, Bld 162 (*)   ? Total Bilirubin 1.3 (*)   ? All other components within normal limits   ?CBC - Abnormal; Notable for the following components:  ? RBC 5.82 (*)   ? Hemoglobin 18.4 (*)   ? HCT 52.9 (*)   ? All other components within normal limits  ?URINALYSIS, ROUTINE W REFLEX MICROSCOPIC - Abnormal; Notable for the following components:  ? APPearance HAZY (*)   ? Hgb urine dipstick SMALL (*)   ? Bacteria, UA RARE (*)   ? All other components within normal limits  ?C DIFFICILE QUICK SCREEN W PCR REFLEX    ?RESP PANEL BY RT-PCR (FLU A&B, COVID) [id number]  ?GASTROINTESTINAL PANEL BY PCR, STOOL (REPLACES STOOL CULTURE)  ?LIPASE, BLOOD  ? ? ?EKG ?None ? ?Radiology ?CT ABDOMEN PELVIS W CONTRAST ? ?Result Date: 07/03/2021 ?CLINICAL DATA:  Nausea, vomiting EXAM: CT ABDOMEN AND PELVIS WITH CONTRAST TECHNIQUE: Multidetector CT imaging of the abdomen and pelvis was performed using the standard protocol following bolus administration of intravenous contrast. RADIATION DOSE REDUCTION: This exam was performed according to the departmental dose-optimization program which includes automated exposure control, adjustment of the mA and/or kV according to patient size and/or use of iterative reconstruction technique. CONTRAST:  138m OMNIPAQUE IOHEXOL 300 MG/ML  SOLN COMPARISON:  None. FINDINGS: Lower chest: Calcifications in the visualized right coronary artery. No acute abnormality Hepatobiliary: Prior cholecystectomy. Diffuse fatty infiltration of the liver. No focal hepatic abnormality. Pancreas: No focal abnormality or ductal dilatation. Spleen: No focal abnormality.  Normal size. Adrenals/Urinary Tract: No adrenal abnormality. No focal renal abnormality. No stones or hydronephrosis. Urinary bladder is unremarkable. Stomach/Bowel: Normal appendix. Stomach, large and small bowel grossly unremarkable. Vascular/Lymphatic: Aortic atherosclerosis. No evidence of aneurysm or adenopathy. Reproductive: No visible focal abnormality. Other: No free fluid or free air. Musculoskeletal: No acute bony abnormality. IMPRESSION: Hepatic  steatosis. Coronary artery disease, aortic atherosclerosis. No acute findings. Electronically Signed   By: KRolm BaptiseM.D.   On: 07/03/2021 23:08   ? ?Procedures ?Procedures  ? ? ?Medications Ordered in ED ?Medications  ?bismuth subsalicylate (PEPTO BISMOL) chewable tablet 524 mg (has no administration in time range)  ?lactated ringers bolus 1,000 mL (0 mLs Intravenous Stopped 07/03/21 2249)  ?ondansetron ([facility] injection 4 mg (4 mg Intravenous Given 07/03/21 2131)  ?iohexol (OMNIPAQUE) 300 MG/ML solution 100 mL (100 mLs Intravenous Contrast Given 07/03/21 2304)  ? ? ?ED Course/ Medical Decision Making/ A&P ?  ?                        ?Medical Decision Making ?Amount and/or Complexity of Data Reviewed ?Labs: ordered. ?Radiology: ordered. ? ?Risk ?OTC drugs. ?Prescription drug management. ? ? ?Patient is well appearing on  exam. Is tachycardic, though with zofran and fluids this has improved. NO peritonitis on exam. CT abd obtained, and is unremarkable. I have personally reviewed/interpreted these images.  Labs show hemoconcentration likely from dehydration.  Otherwise no acute kidney injury.  Normal WBC.  He was able to give a stool sample and the C. difficile testing is negative.  He is feeling better with the Zofran and fluids.  He feels well enough for discharge home.  At this point he has a low-grade temperature but not quite a fever.  However I discussed that even if he does have infectious diarrhea/gastroenteritis, we would not typically put on antibiotics if not having bloody stools, high fever, sick enough to be in the hospital, etc.  GI pathogen panel is pending but I think is reasonable to discharge him home with supportive care.  He was advised to add Pepto-Bismol to Imodium.  Will give Zofran prescription.  Given return precautions.   ? ? ? ? ? ?Final Clinical Impression(s) / ED Diagnoses ?Final diagnoses:  ?Gastroenteritis  ? ? ?Rx / DC Orders ?ED Discharge Orders   ? ?      Ordered  ?  ondansetron  (ZOFRAN-ODT) 4 MG disintegrating tablet  Every 8 hours PRN       ? 07/03/21 2320  ? ?  ?  ? ?  ? ? ?  ?Sherwood Gambler, MD ?07/03/21 2324 ? ?

## 2021-07-03 NOTE — ED Triage Notes (Signed)
Pt arrived via POV for N//[id number]. Pt states vomited once today. Pt denies anyone else in house with same sx. Pt denies recent use of atx. ?

## 2021-07-03 NOTE — Discharge Instructions (Signed)
If you develop worsening, continued, or recurrent abdominal pain, uncontrolled vomiting, fever, bloody diarrhea, chest or back pain, or any other new/concerning symptoms then return to the ER for evaluation.  ?

## 2021-07-04 LAB — GASTROINTESTINAL PANEL BY PCR, STOOL (REPLACES STOOL CULTURE)

## 2021-07-04 LAB — RESP PANEL BY RT-PCR (FLU A&B, COVID) ARPGX2
Influenza A by PCR: NEGATIVE
Influenza B by PCR: NEGATIVE
SARS Coronavirus 2 by RT PCR: NEGATIVE

## 2021-07-26 ENCOUNTER — Ambulatory Visit (INDEPENDENT_AMBULATORY_CARE_PROVIDER_SITE_OTHER): Payer: Medicare Other | Admitting: Family

## 2021-07-26 ENCOUNTER — Encounter: Payer: Self-pay | Admitting: Family

## 2021-07-26 VITALS — BP 119/78 | HR 72 | Temp 98.1°F | Ht 71.0 in | Wt 191.4 lb

## 2021-07-26 DIAGNOSIS — M1810 Unilateral primary osteoarthritis of first carpometacarpal joint, unspecified hand: Secondary | ICD-10-CM | POA: Diagnosis not present

## 2021-07-26 DIAGNOSIS — Z8546 Personal history of malignant neoplasm of prostate: Secondary | ICD-10-CM

## 2021-07-26 DIAGNOSIS — Z794 Long term (current) use of insulin: Secondary | ICD-10-CM | POA: Diagnosis not present

## 2021-07-26 DIAGNOSIS — Z23 Encounter for immunization: Secondary | ICD-10-CM

## 2021-07-26 DIAGNOSIS — K219 Gastro-esophageal reflux disease without esophagitis: Secondary | ICD-10-CM

## 2021-07-26 DIAGNOSIS — E1169 Type 2 diabetes mellitus with other specified complication: Secondary | ICD-10-CM

## 2021-07-26 DIAGNOSIS — E785 Hyperlipidemia, unspecified: Secondary | ICD-10-CM

## 2021-07-26 MED ORDER — DICLOFENAC SODIUM 75 MG PO TBEC: 75.0000 mg | DELAYED_RELEASE_TABLET | Freq: Two times a day (BID) | ORAL | 2 refills | Status: DC

## 2021-07-26 NOTE — Patient Instructions (Signed)
Health Maintenance After Age 68 After age 68, you are at a higher risk for certain long-term diseases and infections as well as injuries from falls. Falls are a major cause of broken bones and head injuries in people who are older than age 68. Getting regular preventive care can help to keep you healthy and well. Preventive care includes getting regular testing and making lifestyle changes as recommended by your health care provider. Talk with your health care provider about: Which screenings and tests you should have. A screening is a test that checks for a disease when you have no symptoms. A diet and exercise plan that is right for you. What should I know about screenings and tests to prevent falls? Screening and testing are the best ways to find a health problem early. Early diagnosis and treatment give you the best chance of managing medical conditions that are common after age 68. Certain conditions and lifestyle choices may make you more likely to have a fall. Your health care provider may recommend: Regular vision checks. Poor vision and conditions such as cataracts can make you more likely to have a fall. If you wear glasses, make sure to get your prescription updated if your vision changes. Medicine review. Work with your health care provider to regularly review all of the medicines you are taking, including over-the-counter medicines. Ask your health care provider about any side effects that may make you more likely to have a fall. Tell your health care provider if any medicines that you take make you feel dizzy or sleepy. Strength and balance checks. Your health care provider may recommend certain tests to check your strength and balance while standing, walking, or changing positions. Foot health exam. Foot pain and numbness, as well as not wearing proper footwear, can make you more likely to have a fall. Screenings, including: Osteoporosis screening. Osteoporosis is a condition that causes  the bones to get weaker and break more easily. Blood pressure screening. Blood pressure changes and medicines to control blood pressure can make you feel dizzy. Depression screening. You may be more likely to have a fall if you have a fear of falling, feel depressed, or feel unable to do activities that you used to do. Alcohol use screening. Using too much alcohol can affect your balance and may make you more likely to have a fall. Follow these instructions at home: Lifestyle Do not drink alcohol if: Your health care provider tells you not to drink. If you drink alcohol: Limit how much you have to: 0-1 drink a day for women. 0-2 drinks a day for men. Know how much alcohol is in your drink. In the [place] one drink equals one 12 oz bottle of beer (355 mL), one 5 oz glass of wine (148 mL), or one 1 oz glass of hard liquor (44 mL). Do not use any products that contain nicotine or tobacco. These products include cigarettes, chewing tobacco, and vaping devices, such as e-cigarettes. If you need help quitting, ask your health care provider. Activity  Follow a regular exercise program to stay fit. This will help you maintain your balance. Ask your health care provider what types of exercise are appropriate for you. If you need a cane or walker, use it as recommended by your health care provider. Wear supportive shoes that have nonskid soles. Safety  Remove any tripping hazards, such as rugs, cords, and clutter. Install safety equipment such as grab bars in bathrooms and safety rails on stairs. Keep rooms and walkways   well-lit. General instructions Talk with your health care provider about your risks for falling. Tell your health care provider if: You fall. Be sure to tell your health care provider about all falls, even ones that seem minor. You feel dizzy, tiredness (fatigue), or off-balance. Take over-the-counter and prescription medicines only as told by your health care provider. These include  supplements. Eat a healthy diet and maintain a healthy weight. A healthy diet includes low-fat dairy products, low-fat (lean) meats, and fiber from whole grains, beans, and lots of fruits and vegetables. Stay current with your vaccines. Schedule regular health, dental, and eye exams. Summary Having a healthy lifestyle and getting preventive care can help to protect your health and wellness after age 68. Screening and testing are the best way to find a health problem early and help you avoid having a fall. Early diagnosis and treatment give you the best chance for managing medical conditions that are more common for people who are older than age 68. Falls are a major cause of broken bones and head injuries in people who are older than age 68. Take precautions to prevent a fall at home. Work with your health care provider to learn what changes you can make to improve your health and wellness and to prevent falls. This information is not intended to replace advice given to you by your health care provider. Make sure you discuss any questions you have with your health care provider. Document Revised: 08/28/2020 Document Reviewed: 08/28/2020 Elsevier Patient Education  2022 [employer]  

## 2021-07-26 NOTE — Progress Notes (Signed)
? ?Subjective:  ? ? Patient ID: Jonathan Washington, male    DOB: [birth year], 66 y.o.   MRN: [id number] ? ?Chief Complaint  ?Patient presents with  ? Medical Management of Chronic Issues  ?  Hand pain bilateral   ? Stress  ?  Sister has dementia   ? ?Pt presents to the office today to establish care. He is followed by Urologists every 6 months for hx of Prostate Cancer 2014. He is followed by Endo every 6 months for DM. His last A1C was 6.1 that was drawn in Feb. He states he does exercising class twice a week for 45 mins.  ?Gastroesophageal Reflux ?He complains of belching and heartburn. This is a chronic problem. The current episode started more than 1 year ago. The problem occurs occasionally. The problem has been waxing and waning. Risk factors include obesity. He has tried a PPI for the symptoms. The treatment provided moderate relief.  ?Hyperlipidemia ?This is a chronic problem. The current episode started more than 1 year ago. The problem is controlled. Recent lipid tests were reviewed and are normal. Exacerbating diseases include obesity. Current antihyperlipidemic treatment includes statins. The current treatment provides moderate improvement of lipids. Risk factors for coronary artery disease include dyslipidemia, male sex, hypertension and a sedentary lifestyle.  ?Diabetes ?He presents for his follow-up diabetic visit. He has type 2 diabetes mellitus. Pertinent negatives for diabetes include no blurred vision and no foot paresthesias. Symptoms are stable. Risk factors for coronary artery disease include dyslipidemia, diabetes mellitus, male sex, hypertension and sedentary lifestyle. He is following a generally healthy diet. His overall blood glucose range is 90-110 mg/dl. An ACE inhibitor/angiotensin II receptor blocker is being taken. Eye exam is current.  ?Arthritis ?Presents for follow-up visit. He complains of pain and stiffness. The symptoms have been stable. Affected locations include the left MCP  and right MCP. His pain is at a severity of 4/10.  ? ? ? ?Review of Systems  ?Eyes:  Negative for blurred vision.  ?Gastrointestinal:  Positive for heartburn.  ?Musculoskeletal:  Positive for arthritis and stiffness.  ?All other systems reviewed and are negative. ? ?   ?Objective:  ? Physical Exam ?Vitals reviewed.  ?Constitutional:   ?   General: He is not in acute distress. ?   Appearance: He is well-developed.  ?HENT:  ?   Head: Normocephalic.  ?   Right Ear: Tympanic membrane normal.  ?   Left Ear: Tympanic membrane normal.  ?Eyes:  ?   General:     ?   Right eye: No discharge.     ?   Left eye: No discharge.  ?   Pupils: Pupils are equal, round, and reactive to light.  ?Neck:  ?   Thyroid: No thyromegaly.  ?Cardiovascular:  ?   Rate and Rhythm: Normal rate and regular rhythm.  ?   Heart sounds: Normal heart sounds. No murmur heard. ?Pulmonary:  ?   Effort: Pulmonary effort is normal. No respiratory distress.  ?   Breath sounds: Normal breath sounds. No wheezing.  ?Abdominal:  ?   General: Bowel sounds are normal. There is no distension.  ?   Palpations: Abdomen is soft.  ?   Tenderness: There is no abdominal tenderness.  ?Musculoskeletal:     ?   General: No tenderness. Normal range of motion.  ?   Cervical back: Normal range of motion and neck supple.  ?Skin: ?   General: Skin is warm and dry.  ?  Findings: No erythema or rash.  ?Neurological:  ?   Mental Status: He is alert and oriented to person, place, and time.  ?   Cranial Nerves: No cranial nerve deficit.  ?   Deep Tendon Reflexes: Reflexes are normal and symmetric.  ?Psychiatric:     ?   Behavior: Behavior normal.     ?   Thought Content: Thought content normal.     ?   Judgment: Judgment normal.  ? ? ? ? ?BP 119/78   Pulse 72   Temp 98.1 ?F (36.7 ?C) (Temporal)   Ht '5\' 11"'$  (1.803 m)   Wt 191 lb 6.4 oz (86.8 kg)   BMI 26.69 kg/m?  ? ?   ?Assessment & Plan:  ?Jonathan Washington comes in today with chief complaint of Medical Management of Chronic  Issues (Hand pain bilateral ) and Stress (Sister has dementia ) ? ? ?Diagnosis and orders addressed: ? ?1. Type 2 diabetes mellitus with other specified complication, with long-term current use of insulin ([place]) ? ?2. Hyperlipidemia associated with type 2 diabetes mellitus ([place]) ? ?3. Gastroesophageal reflux disease, unspecified whether esophagitis present ? ? ?4. Arthritis of carpometacarpal (CMC) joint of thumb ?Start diclofenac BID with food ?No other NSAID's ?- diclofenac (VOLTAREN) 75 MG EC tablet; Take 1 tablet (75 mg total) by mouth 2 (two) times daily.  Dispense: 180 tablet; Refill: 2 ? ?5. H/O prostate cancer ? ?6. Need for pneumococcal vaccine ?- Pneumococcal conjugate vaccine 20-valent (Prevnar 20) ? ?7. Need for shingles vaccine ?- Varicella-zoster vaccine IM (Shingrix) ? ? ?Labs pending ?Health Maintenance reviewed ?Diet and exercise encouraged ? ?Follow up plan: ?6 months  ? ?Evelina Dun, FNP ? ? ?

## 2021-08-11 ENCOUNTER — Other Ambulatory Visit: Payer: Self-pay | Admitting: Family

## 2021-08-27 ENCOUNTER — Other Ambulatory Visit: Payer: Self-pay | Admitting: Family

## 2021-09-18 ENCOUNTER — Other Ambulatory Visit: Payer: Self-pay | Admitting: Family

## 2021-09-29 ENCOUNTER — Other Ambulatory Visit: Payer: Self-pay | Admitting: Family

## 2021-11-03 ENCOUNTER — Other Ambulatory Visit: Payer: Self-pay | Admitting: Family

## 2021-11-07 ENCOUNTER — Ambulatory Visit (INDEPENDENT_AMBULATORY_CARE_PROVIDER_SITE_OTHER): Payer: Medicare Other

## 2021-11-07 ENCOUNTER — Encounter: Payer: Self-pay | Admitting: Orthopaedic Surgery

## 2021-11-07 ENCOUNTER — Ambulatory Visit (INDEPENDENT_AMBULATORY_CARE_PROVIDER_SITE_OTHER): Payer: Medicare Other | Admitting: Orthopaedic Surgery

## 2021-11-07 DIAGNOSIS — G8929 Other chronic pain: Secondary | ICD-10-CM | POA: Diagnosis not present

## 2021-11-07 DIAGNOSIS — M545 Low back pain, unspecified: Secondary | ICD-10-CM

## 2021-11-07 DIAGNOSIS — M5442 Lumbago with sciatica, left side: Secondary | ICD-10-CM | POA: Diagnosis not present

## 2021-11-07 DIAGNOSIS — M25552 Pain in left hip: Secondary | ICD-10-CM

## 2021-11-07 NOTE — Progress Notes (Signed)
Office Visit Note   Patient: Jonathan Washington           Date of Birth: 18-Apr-1956           MRN: [id number] Visit Date: 11/07/2021              Requested by: Jonathan Washington, Jonathan Washington [place],  [place] [address] Jonathan Washington   Assessment & Plan: Visit Diagnoses:  1. Lumbar spine pain   2. Pain in left hip   3. Chronic left-sided low back pain with left-sided sciatica     Plan: Jonathan Washington has been experiencing significant left leg pain for approximately a week.  He denies any history of injury or trauma.  He has had a history of prostate cancer being treated initially in 2014 with radiation.  His PSAs have been consistently below 1.  He notes its worse when he stands and has had some associated back pain particularly at night.  He has been taking ibuprofen and Tylenol which seems to help.  Oftentimes the pain is referred from the thigh across the knee into his left calf.  On occasion he feels some tingling in his feet.  Is very active around the house and outside feels like he is being somewhat compromised related to the pain.  Sometimes he does have trouble sleeping.  I think the problem originates from his lumbar spine.  I like to obtain an MRI scan as I suspect he does have stenosis and would be a good candidate for an epidural steroid injection.  I do not think therapy is going to be helpful at this point  Follow-Up Instructions: Return After MRI scan lumbar spine.   Orders:  Orders Placed This Encounter  Procedures   XR Lumbar Spine 2-3 Views   XR Pelvis 1-2 Views   No orders of the defined types were placed in this encounter.     Procedures: No procedures performed   Clinical Data: No additional findings.   Subjective: Chief Complaint  Patient presents with   Left Thigh - Follow-up   Patient presents today for follow up off his left thing. He states that he noticed pain around 1 week ago. He denies having any injuries or falls to  have started his pain. Patient denies having any previous surgeries to have started his pain as well. Pain has been noticed to radiate down his left thigh and into his lower leg. Pain is increased while patient is laying down or standing. Pain has been noticed to decrease while he is walking.   Review of Systems   Objective: Vital Signs: There were no vitals taken for this visit.  Physical Exam Constitutional:      Appearance: He is well-developed.  Eyes:     Pupils: Pupils are equal, round, and reactive to light.  Pulmonary:     Effort: Pulmonary effort is normal.  Skin:    General: Skin is warm and dry.  Neurological:     Mental Status: He is alert and oriented to person, place, and time.  Psychiatric:        Behavior: Behavior normal.     Ortho Exam awake alert and oriented x3.  Comfortable sitting in no acute distress.  There were no areas of tenderness about the greater trochanter left hip or the thigh.  No knee pain.  Absolutely no pain with internal/external rotation or hesitation of motion of the left hip.  Straight leg raise was negative.  Motor exam intact.  No distal edema.  Very mild percussible tenderness of the lumbar spine but not over either SI joint Specialty Comments:  No specialty comments available.  Imaging: XR Lumbar Spine 2-3 Views  Result Date: 11/07/2021 Films of the lumbar spine obtained in 2 projections.  There is a degenerative scoliosis to the left apex around L L2 or 3.  There is decreased joint space at the L1-2 disc space.  Very mild calcification of the abdominal aorta.  Significant facet sclerosis at L4-5 and L5-S1.  No listhesis.  Films are consistent with diffuse osteoarthritis with grade changes at L4-5 and L5-S1    PMFS History: Patient Active Problem List   Diagnosis Date Noted   Low back pain 11/07/2021   Metacarpal bone fracture 12/21/2020   Diabetes mellitus ([place]) 07/27/2020   Gastroesophageal reflux disease 07/27/2020    Hyperlipidemia associated with type 2 diabetes mellitus ([place]) 07/27/2020   H/O prostate cancer 07/27/2020   Arthritis of carpometacarpal (CMC) joint of thumb 12/01/2019   Vestibular hypofunction of left ear 12/17/2018   Malignant neoplasm of prostate ([place]) 08/11/2018   Sensorineural hearing loss (SNHL), bilateral 01/06/2018   Vertigo 01/06/2018   Past Medical History:  Diagnosis Date   Cataract    Dyslipidemia    Fatty liver    Fibromyalgia    GERD (gastroesophageal reflux disease)    Glaucoma    RIGHT EYE   Glucose intolerance (impaired glucose tolerance)    Hypertension    Pneumonia    4 YRS AGO   Prostate cancer (HCC)    Vertigo    ESPECIALLY IF PT TURNS HIS HEAD OR MOVES A CERTAIN WAY    Family History  Problem Relation Age of Onset   Breast cancer Mother    Bladder Cancer Father    Throat cancer Father    Lung cancer Paternal Aunt        non smoker    Past Surgical History:  Procedure Laterality Date   CHOLECYSTECTOMY     EYE SURGERY Bilateral    cataracts   LEFT KNEE ARTHROSCOPY     LYMPHADENECTOMY  05/27/2012   Procedure: LYMPHADENECTOMY;  Surgeon: Bernestine Amass, MD;  Location: [facility];  Service: Urology;  Laterality: Bilateral;   ROBOT ASSISTED LAPAROSCOPIC RADICAL PROSTATECTOMY  05/27/2012   Procedure: ROBOTIC ASSISTED LAPAROSCOPIC RADICAL PROSTATECTOMY;  Surgeon: Bernestine Amass, MD;  Location: [facility];  Service: Urology;  Laterality: N/A;   SURGERY FOR DEVIATED NASAL SEPTUM     Social History   Occupational History    Comment: retired since 2015  Tobacco Use   Smoking status: Former    Packs/day: 2.00    Years: 15.00    Total pack years: 30.00    Types: Cigarettes    Quit date: 04/22/1978    Years since quitting: 43.5   Smokeless tobacco: Never   Tobacco comments:    [place]  Vaping Use   Vaping Use: Never used  Substance and Sexual Activity   Alcohol use: Yes    Alcohol/week: 2.0 standard drinks of alcohol    Types: 2 Cans of beer per  week   Drug use: No   Sexual activity: Yes    Comment: with aid of Viagra

## 2021-11-07 NOTE — Addendum Note (Signed)
Addended by: Melton Alar on: 11/07/2021 04:17 PM   Modules accepted: Orders

## 2021-11-12 ENCOUNTER — Ambulatory Visit
Admission: RE | Admit: 2021-11-12 | Discharge: 2021-11-12 | Disposition: A | Discharge status: Home / Self Care | Payer: Medicare Other | Source: Ambulatory Visit | Attending: Orthopaedic Surgery | Admitting: Orthopaedic Surgery

## 2021-11-12 DIAGNOSIS — G8929 Other chronic pain: Secondary | ICD-10-CM

## 2021-11-12 DIAGNOSIS — M545 Low back pain, unspecified: Secondary | ICD-10-CM

## 2021-11-14 ENCOUNTER — Ambulatory Visit (INDEPENDENT_AMBULATORY_CARE_PROVIDER_SITE_OTHER): Payer: Medicare Other | Admitting: Orthopaedic Surgery

## 2021-11-14 ENCOUNTER — Other Ambulatory Visit: Payer: Medicare Other

## 2021-11-14 ENCOUNTER — Encounter: Payer: Self-pay | Admitting: Orthopaedic Surgery

## 2021-11-14 DIAGNOSIS — M5442 Lumbago with sciatica, left side: Secondary | ICD-10-CM | POA: Diagnosis not present

## 2021-11-14 DIAGNOSIS — G8929 Other chronic pain: Secondary | ICD-10-CM

## 2021-11-14 MED ORDER — HYDROCODONE-ACETAMINOPHEN 5-325 MG PO TABS: 1.0000 | ORAL_TABLET | Freq: Four times a day (QID) | ORAL | 0 refills | Status: DC | PRN

## 2021-11-14 NOTE — Progress Notes (Signed)
Office Visit Note   Patient: Jonathan Washington           Date of Birth: 1955-10-29           MRN: [id number] Visit Date: 11/14/2021              Requested by: Sharion Balloon, [address] [place],  [place] [address] PCP: Sharion Balloon, FNP   Assessment & Plan: Visit Diagnoses:  1. Chronic left-sided low back pain with left-sided sciatica     Plan: Mr. Dugar is accompanied by his wife and returns to the office for follow-up evaluation of the MRI scan of the lumbar spine.  He has significant degenerative changes throughout the lumbar spine but does have foraminal stenosis on the left at L4-5.  There is a combination of bilateral facet spurring with disc narrowing and bulging.  He has asymmetric left subarticular recess narrowing and overall mild spinal stenosis.  I do believe that is the cause of his back and left leg pain.  He has trouble sleeping at night.  Denies any groin pain.  Films of his left hip were nondiagnostic.  Neurologically intact.  Straight leg raise is negative.  I think he do well with an epidural steroid injection.  We will try and get the schedule soon as possible I will also prescribe hydrocodone so he can sleep at night   Follow-Up Instructions: Return in about 1 month (around 12/15/2021).   Orders:  No orders of the defined types were placed in this encounter.  Meds ordered this encounter  Medications   HYDROcodone-acetaminophen (NORCO/VICODIN) 5-325 MG tablet    Sig: Take 1 tablet by mouth every 6 (six) hours as needed for moderate pain.    Dispense:  30 tablet    Refill:  0      Procedures: No procedures performed   Clinical Data: No additional findings.   Subjective: Chief Complaint  Patient presents with   Jonathan Washington returns for reevaluation.  He has had an MRI scan of his lumbar spine and is here for the results.  Still having considerable pain in his left buttock left leg even at night.  No change in  bowel or bladder function.  Review of Systems   Objective: Vital Signs: There were no vitals taken for this visit.  Physical Exam Constitutional:      Appearance: He is well-developed.  Pulmonary:     Effort: Pulmonary effort is normal.  Skin:    General: Skin is warm and dry.  Neurological:     Mental Status: He is alert and oriented to person, place, and time.  Psychiatric:        Behavior: Behavior normal.     Ortho Exam awake alert and oriented x3.  Comfortable sitting.  Straight leg raise negative.  Still no pain with internal/external rotation of his left hip nor any loss of motion.  Motor exam intact.  No localized areas of tenderness about the lateral aspect of the hip or thigh.  No percussible back pain  Specialty Comments:  No specialty comments available.  Imaging: No results found.   PMFS History: Patient Active Problem List   Diagnosis Date Noted   Low back pain 11/07/2021   Metacarpal bone fracture 12/21/2020   Diabetes mellitus (Jonathan Washington) 07/27/2020   Gastroesophageal reflux disease 07/27/2020   Hyperlipidemia associated with type 2 diabetes mellitus (Jonathan Washington) 07/27/2020   H/O prostate cancer 07/27/2020   Arthritis  of carpometacarpal [facility]) joint of thumb 12/01/2019   Vestibular hypofunction of left ear 12/17/2018   Malignant neoplasm of prostate (Jonathan Washington) 08/11/2018   Sensorineural hearing loss (SNHL), bilateral 01/06/2018   Vertigo 01/06/2018   Past Medical History:  Diagnosis Date   Cataract    Dyslipidemia    Fatty liver    Fibromyalgia    GERD (gastroesophageal reflux disease)    Glaucoma    RIGHT EYE   Glucose intolerance (impaired glucose tolerance)    Hypertension    Pneumonia    4 YRS AGO   Prostate cancer (Jonathan Washington)    Vertigo    ESPECIALLY IF PT TURNS HIS HEAD OR MOVES A CERTAIN WAY    Family History  Problem Relation Age of Onset   Breast cancer Mother    Bladder Cancer Father    Throat cancer Father    Lung cancer Paternal Aunt        non  smoker    Past Surgical History:  Procedure Laterality Date   CHOLECYSTECTOMY     EYE SURGERY Bilateral    cataracts   LEFT KNEE ARTHROSCOPY     LYMPHADENECTOMY  05/27/2012   Procedure: LYMPHADENECTOMY;  Surgeon: Bernestine Amass, MD;  Location: [facility];  Service: Urology;  Laterality: Bilateral;   ROBOT ASSISTED LAPAROSCOPIC RADICAL PROSTATECTOMY  05/27/2012   Procedure: ROBOTIC ASSISTED LAPAROSCOPIC RADICAL PROSTATECTOMY;  Surgeon: Bernestine Amass, MD;  Location: [facility];  Service: Urology;  Laterality: N/A;   SURGERY FOR DEVIATED NASAL SEPTUM     Social History   Occupational History    Comment: retired since 2015  Tobacco Use   Smoking status: Former    Packs/day: 2.00    Years: 15.00    Total pack years: 30.00    Types: Cigarettes    Quit date: 04/22/1978    Years since quitting: 43.5   Smokeless tobacco: Never   Tobacco comments:    Jonathan Washington  Vaping Use   Vaping Use: Never used  Substance and Sexual Activity   Alcohol use: Yes    Alcohol/week: 2.0 standard drinks of alcohol    Types: 2 Cans of beer per week   Drug use: No   Sexual activity: Yes    Comment: with aid of Viagra     Garald Balding, MD   Note - This record has been created using Editor, commissioning.  Chart creation errors have been sought, but may not always  have been located. Such creation errors do not reflect on  the standard of medical care.

## 2021-11-14 NOTE — Progress Notes (Deleted)
Office Visit Note   Patient: Jonathan Washington           Date of Birth: Nov 11, 1955           MRN: [id number] Visit Date: 11/14/2021              Requested by: Sharion Balloon, [address] [place],  [place] [address] PCP: Sharion Balloon, FNP   Assessment & Plan: Visit Diagnoses: No diagnosis found.  Plan: ***  Follow-Up Instructions: No follow-ups on file.   Orders:  No orders of the defined types were placed in this encounter.  No orders of the defined types were placed in this encounter.     Procedures: No procedures performed   Clinical Data: No additional findings.   Subjective: Chief Complaint  Patient presents with   Lower Back - Follow-up   Patient presents today for MRI review of his lumbar spine. He states that since he was last seen in the office pain has gradually been getting worse and becoming more constant. Patient states that pain has been radiating down into his hip. At this time he is currently taking ibuprofen for pain.   Review of Systems   Objective: Vital Signs: There were no vitals taken for this visit.  Physical Exam  Ortho Exam  Specialty Comments:  No specialty comments available.  Imaging: No results found.   PMFS History: Patient Active Problem List   Diagnosis Date Noted   Low back pain 11/07/2021   Metacarpal bone fracture 12/21/2020   Diabetes mellitus ([place]) 07/27/2020   Gastroesophageal reflux disease 07/27/2020   Hyperlipidemia associated with type 2 diabetes mellitus ([place]) 07/27/2020   H/O prostate cancer 07/27/2020   Arthritis of carpometacarpal (CMC) joint of thumb 12/01/2019   Vestibular hypofunction of left ear 12/17/2018   Malignant neoplasm of prostate ([place]) 08/11/2018   Sensorineural hearing loss (SNHL), bilateral 01/06/2018   Vertigo 01/06/2018   Past Medical History:  Diagnosis Date   Cataract    Dyslipidemia    Fatty liver    Fibromyalgia    GERD (gastroesophageal reflux disease)     Glaucoma    RIGHT EYE   Glucose intolerance (impaired glucose tolerance)    Hypertension    Pneumonia    4 YRS AGO   Prostate cancer (HCC)    Vertigo    ESPECIALLY IF PT TURNS HIS HEAD OR MOVES A CERTAIN WAY    Family History  Problem Relation Age of Onset   Breast cancer Mother    Bladder Cancer Father    Throat cancer Father    Lung cancer Paternal Aunt        non smoker    Past Surgical History:  Procedure Laterality Date   CHOLECYSTECTOMY     EYE SURGERY Bilateral    cataracts   LEFT KNEE ARTHROSCOPY     LYMPHADENECTOMY  05/27/2012   Procedure: LYMPHADENECTOMY;  Surgeon: Bernestine Amass, MD;  Location: [facility];  Service: Urology;  Laterality: Bilateral;   ROBOT ASSISTED LAPAROSCOPIC RADICAL PROSTATECTOMY  05/27/2012   Procedure: ROBOTIC ASSISTED LAPAROSCOPIC RADICAL PROSTATECTOMY;  Surgeon: Bernestine Amass, MD;  Location: [facility];  Service: Urology;  Laterality: N/A;   SURGERY FOR DEVIATED NASAL SEPTUM     Social History   Occupational History    Comment: retired since 2015  Tobacco Use   Smoking status: Former    Packs/day: 2.00    Years: 15.00    Total pack years: 30.00  Types: Cigarettes    Quit date: 04/22/1978    Years since quitting: 43.5   Smokeless tobacco: Never   Tobacco comments:    [place]  Vaping Use   Vaping Use: Never used  Substance and Sexual Activity   Alcohol use: Yes    Alcohol/week: 2.0 standard drinks of alcohol    Types: 2 Cans of beer per week   Drug use: No   Sexual activity: Yes    Comment: with aid of Viagra

## 2021-11-19 ENCOUNTER — Telehealth: Payer: Self-pay | Admitting: *Deleted

## 2021-11-19 ENCOUNTER — Other Ambulatory Visit: Payer: Self-pay

## 2021-11-19 DIAGNOSIS — G8929 Other chronic pain: Secondary | ICD-10-CM

## 2021-11-19 NOTE — Telephone Encounter (Signed)
Called [place] with [facility] left vm letting her know order has been placed for ESI- she will contact pt to scheudle appt

## 2021-11-19 NOTE — Telephone Encounter (Signed)
Pt was seen last Wednesday and was told there will be order placed for injection for Spine but has not heard from anyone in getting scheduled.  Pt called  imaging and they told him there was no order placed for injection- I do not see where an order has been placed.

## 2021-11-23 ENCOUNTER — Ambulatory Visit
Admission: RE | Admit: 2021-11-23 | Discharge: 2021-11-23 | Disposition: A | Discharge status: Home / Self Care | Payer: Medicare Other | Source: Ambulatory Visit | Attending: Orthopaedic Surgery | Admitting: Orthopaedic Surgery

## 2021-11-23 ENCOUNTER — Other Ambulatory Visit: Payer: Self-pay | Admitting: Family

## 2021-11-23 DIAGNOSIS — G8929 Other chronic pain: Secondary | ICD-10-CM

## 2021-11-23 MED ORDER — IOPAMIDOL (ISOVUE-M 200) INJECTION 41%
1.0000 mL | Freq: Once | INTRAMUSCULAR | Status: AC
  Administered 2021-11-23: 1 mL via EPIDURAL

## 2021-11-23 MED ORDER — METHYLPREDNISOLONE ACETATE 40 MG/ML INJ SUSP (RADIOLOG
80.0000 mg | Freq: Once | INTRAMUSCULAR | Status: AC
  Administered 2021-11-23: 80 mg via EPIDURAL

## 2021-11-23 NOTE — Discharge Instructions (Signed)

## 2021-12-08 ENCOUNTER — Other Ambulatory Visit: Payer: Self-pay | Admitting: Family

## 2021-12-12 ENCOUNTER — Encounter: Payer: Self-pay | Admitting: Orthopaedic Surgery

## 2021-12-12 ENCOUNTER — Ambulatory Visit (INDEPENDENT_AMBULATORY_CARE_PROVIDER_SITE_OTHER): Payer: Medicare Other | Admitting: Orthopaedic Surgery

## 2021-12-12 DIAGNOSIS — G8929 Other chronic pain: Secondary | ICD-10-CM

## 2021-12-12 DIAGNOSIS — M5442 Lumbago with sciatica, left side: Secondary | ICD-10-CM | POA: Diagnosis not present

## 2021-12-12 NOTE — Progress Notes (Signed)
Office Visit Note   Patient: Jonathan Washington           Date of Birth: 02/22/1956           MRN: [id number] Visit Date: 12/12/2021              Requested by: Sharion Balloon, [address] [place],  [place] [address] PCP: Sharion Balloon, FNP   Assessment & Plan: Visit Diagnoses:  1. Chronic left-sided low back pain with left-sided sciatica     Plan: Mr. Lybrand had an epidural steroid injection performed to [facility] imaging and relates that he is doing "at least 85% better.  The pain is "tolerable with over-the-counter medicine.  He had an MRI scan demonstrating disc narrowing and bulging with bilateral inferior foraminal protrusion at L4-5 associated with bilateral facet spurring and left foraminal impingement.  He was experiencing back and left lower extremity pain.  He still has a little back pain but no longer the left leg pain.  Denies any bowel or bladder changes.  Many questions which have answered regarding ongoing treatment should he have any exacerbation.  He will call me.  I think therapy would be worthwhile to set this up for him in [place] Instructions: Return if symptoms worsen or fail to improve.   Orders:  Orders Placed This Encounter  Procedures   Ambulatory referral to Physical Therapy   No orders of the defined types were placed in this encounter.     Procedures: No procedures performed   Clinical Data: No additional findings.   Subjective: Chief Complaint  Patient presents with   Lower Back - Follow-up   Patient presents today for follow up of his lower back pain. Patient states that he was able to receive injection from [facility] imaging on 12/07/2021 and has been having great relief since. He states that at this time he is currently not taking any medications for pain.   Review of Systems   Objective: Vital Signs: There were no vitals taken for this visit.  Physical Exam Constitutional:      Appearance: He is  well-developed.  Eyes:     Pupils: Pupils are equal, round, and reactive to light.  Pulmonary:     Effort: Pulmonary effort is normal.  Skin:    General: Skin is warm and dry.  Neurological:     Mental Status: He is alert and oriented to person, place, and time.  Psychiatric:        Behavior: Behavior normal.     Ortho Exam awake alert and oriented x3.  Comfortable sitting.  No acute distress.  Straight leg raise negative bilaterally.  Motor and sensory exam intact.  No percussible tenderness of lumbar spine.  Ambulates within erect spine and without ambulatory support Specialty Comments:  No specialty comments available.  Imaging: No results found.   PMFS History: Patient Active Problem List   Diagnosis Date Noted   Low back pain 11/07/2021   Metacarpal bone fracture 12/21/2020   Diabetes mellitus ([place]) 07/27/2020   Gastroesophageal reflux disease 07/27/2020   Hyperlipidemia associated with type 2 diabetes mellitus ([place]) 07/27/2020   H/O prostate cancer 07/27/2020   Arthritis of carpometacarpal (CMC) joint of thumb 12/01/2019   Vestibular hypofunction of left ear 12/17/2018   Malignant neoplasm of prostate ([place]) 08/11/2018   Sensorineural hearing loss (SNHL), bilateral 01/06/2018   Vertigo 01/06/2018   Past Medical History:  Diagnosis Date   Cataract    Dyslipidemia  Fatty liver    Fibromyalgia    GERD (gastroesophageal reflux disease)    Glaucoma    RIGHT EYE   Glucose intolerance (impaired glucose tolerance)    Hypertension    Pneumonia    4 YRS AGO   Prostate cancer (HCC)    Vertigo    ESPECIALLY IF PT TURNS HIS HEAD OR MOVES A CERTAIN WAY    Family History  Problem Relation Age of Onset   Breast cancer Mother    Bladder Cancer Father    Throat cancer Father    Lung cancer Paternal Aunt        non smoker    Past Surgical History:  Procedure Laterality Date   CHOLECYSTECTOMY     EYE SURGERY Bilateral    cataracts   LEFT KNEE ARTHROSCOPY      LYMPHADENECTOMY  05/27/2012   Procedure: LYMPHADENECTOMY;  Surgeon: Bernestine Amass, MD;  Location: [facility];  Service: Urology;  Laterality: Bilateral;   ROBOT ASSISTED LAPAROSCOPIC RADICAL PROSTATECTOMY  05/27/2012   Procedure: ROBOTIC ASSISTED LAPAROSCOPIC RADICAL PROSTATECTOMY;  Surgeon: Bernestine Amass, MD;  Location: [facility];  Service: Urology;  Laterality: N/A;   SURGERY FOR DEVIATED NASAL SEPTUM     Social History   Occupational History    Comment: retired since 2015  Tobacco Use   Smoking status: Former    Packs/day: 2.00    Years: 15.00    Total pack years: 30.00    Types: Cigarettes    Quit date: 04/22/1978    Years since quitting: 43.6   Smokeless tobacco: Never   Tobacco comments:    [place]  Vaping Use   Vaping Use: Never used  Substance and Sexual Activity   Alcohol use: Yes    Alcohol/week: 2.0 standard drinks of alcohol    Types: 2 Cans of beer per week   Drug use: No   Sexual activity: Yes    Comment: with aid of Viagra

## 2021-12-18 ENCOUNTER — Ambulatory Visit: Payer: Medicare Other | Attending: Orthopaedic Surgery

## 2021-12-18 ENCOUNTER — Other Ambulatory Visit: Payer: Self-pay

## 2021-12-18 DIAGNOSIS — M79605 Pain in left leg: Secondary | ICD-10-CM | POA: Insufficient documentation

## 2021-12-18 DIAGNOSIS — M5442 Lumbago with sciatica, left side: Secondary | ICD-10-CM | POA: Diagnosis not present

## 2021-12-18 DIAGNOSIS — G8929 Other chronic pain: Secondary | ICD-10-CM | POA: Insufficient documentation

## 2021-12-18 NOTE — Therapy (Signed)
OUTPATIENT PHYSICAL THERAPY THORACOLUMBAR EVALUATION   Patient Name: Jonathan Washington MRN: [id number] DOB:01/24/1956, 66 y.o., male Today's Date: 12/18/2021   PT End of Session - 12/18/21 0929     Visit Number 1    Number of Visits 8    Date for PT Re-Evaluation 02/15/22    PT Start Time 0948    PT Stop Time 1034    PT Time Calculation (min) 46 min    Activity Tolerance Patient tolerated treatment well    Behavior During Therapy WFL for tasks assessed/performed             Past Medical History:  Diagnosis Date   Cataract    Dyslipidemia    Fatty liver    Fibromyalgia    GERD (gastroesophageal reflux disease)    Glaucoma    RIGHT EYE   Glucose intolerance (impaired glucose tolerance)    Hypertension    Pneumonia    4 YRS AGO   Prostate cancer (HCC)    Vertigo    ESPECIALLY IF PT TURNS HIS HEAD OR MOVES A CERTAIN WAY   Past Surgical History:  Procedure Laterality Date   CHOLECYSTECTOMY     EYE SURGERY Bilateral    cataracts   LEFT KNEE ARTHROSCOPY     LYMPHADENECTOMY  05/27/2012   Procedure: LYMPHADENECTOMY;  Surgeon: Bernestine Amass, MD;  Location: [facility];  Service: Urology;  Laterality: Bilateral;   ROBOT ASSISTED LAPAROSCOPIC RADICAL PROSTATECTOMY  05/27/2012   Procedure: ROBOTIC ASSISTED LAPAROSCOPIC RADICAL PROSTATECTOMY;  Surgeon: Bernestine Amass, MD;  Location: [facility];  Service: Urology;  Laterality: N/A;   SURGERY FOR DEVIATED NASAL SEPTUM     Patient Active Problem List   Diagnosis Date Noted   Low back pain 11/07/2021   Metacarpal bone fracture 12/21/2020   Diabetes mellitus ([place]) 07/27/2020   Gastroesophageal reflux disease 07/27/2020   Hyperlipidemia associated with type 2 diabetes mellitus ([place]) 07/27/2020   H/O prostate cancer 07/27/2020   Arthritis of carpometacarpal (CMC) joint of thumb 12/01/2019   Vestibular hypofunction of left ear 12/17/2018   Malignant neoplasm of prostate ([place]) 08/11/2018   Sensorineural hearing loss (SNHL), bilateral  01/06/2018   Vertigo 01/06/2018   REFERRING PROVIDER: Garald Balding, MD  REFERRING DIAG: Chronic left-sided low back pain with left-sided sciatica  Rationale for Evaluation and Treatment Rehabilitation  THERAPY DIAG:  Pain in left leg  ONSET DATE: approximately 5 weeks ago  SUBJECTIVE:  SUBJECTIVE STATEMENT: Patient reports that he began having left hip and leg pain that began about 5 weeks ago. He had an injection about 3 weeks ago which significantly helped. He notes that the injection helped him sleep a lot better as his pain was a 10/10 prior to the injection. However, it is only getting up to a 2/10 since the injection.  PERTINENT HISTORY:  Chronic left hip pain, vertigo, DM, hard of hearing, OA, and history of prostate cancer  PAIN:  Are you having pain? Yes: NPRS scale: 2/10 Pain location: left hip and thigh pain Pain description: aching with intermittent sharp pain Aggravating factors: walking for long periods, worst in the morning Relieving factors: injections and pain medication   PRECAUTIONS: None  WEIGHT BEARING RESTRICTIONS No  FALLS:  Has patient fallen in last 6 months? No  LIVING ENVIRONMENT: Lives with: lives with their family Lives in: House/apartment Has following equipment at home: None  OCCUPATION: retired  PLOF: Independent  PATIENT GOALS walker longer   OBJECTIVE:   DIAGNOSTIC FINDINGS:  7/24 Lumbar MRI:  IMPRESSION: 1. Generalized lumbar spine degeneration with scoliosis. 2. Notable left foraminal impingement at L4-5. Moderate foraminal narrowing on the right at L3-4 and L4-5. 3. L2-3 small right paracentral herniation contacting the right L3 nerve root. 4. Diffusely patent spinal canal.  SCREENING FOR RED FLAGS: Bowel or bladder incontinence:  No Spinal tumors: No Cauda equina syndrome: No Compression fracture: No Abdominal aneurysm: No  COGNITION:  Overall cognitive status: Within functional limits for tasks assessed     SENSATION: Patient reports tingling in the bottom of both feet since his injection  POSTURE: No Significant postural limitations  PALPATION: TTP: left IT band and quadriceps  LUMBAR ROM:   Active  A/PROM  eval  Flexion 50  Extension 15 recreated familiar left hip pain  Right lateral flexion No limitation  Left lateral flexion No limitation  Right rotation No limitation  Left rotation No limitation   (Blank rows = not tested)  LOWER EXTREMITY ROM: WFL for activities assessed    LOWER EXTREMITY MMT:    MMT Right eval Left eval  Hip flexion 4/5 4/5  Hip extension    Hip abduction    Hip adduction    Hip internal rotation    Hip external rotation    Knee flexion 4+/5 5/5  Knee extension 5/5 5/5  Ankle dorsiflexion 4+/5 4+/5  Ankle plantarflexion    Ankle inversion    Ankle eversion     (Blank rows = not tested)  LUMBAR SPECIAL TESTS:  FABER test: Positive  GAIT: Assistive device utilized: None Level of assistance: Complete Independence  TODAY'S TREATMENT                                    8/29 EXERCISE LOG  Exercise Repetitions and Resistance Comments  Lower trunk rotation 10 reps Dull ache with LTR to the right  Bridge 10 reps   SLR  10 reps   Standing hip ABD  10 reps        Blank cell = exercise not performed today    PATIENT EDUCATION:  Education details: playing golf, MRI results, healing, prognosis, POC Person educated: Patient Education method: Explanation Education comprehension: verbalized understanding   HOME EXERCISE PROGRAM: [id number]  ASSESSMENT:  CLINICAL IMPRESSION: Patient is a 66 y.o. male who was seen today for physical therapy evaluation and treatment for left hip  and lower extremity pain. He presented with low pain severity and irritability  with lumbar extension and hip internal rotation being the most aggravating to his familiar symptoms. He was provided a HEP which he was able to complete with no reported increase in his familiar symptoms. Recommend that he continue with skilled physical therapy to address his remaining impairments to return to his prior level of function.    OBJECTIVE IMPAIRMENTS decreased activity tolerance, decreased mobility, difficulty walking, decreased ROM, decreased strength, and pain.   ACTIVITY LIMITATIONS locomotion level  PARTICIPATION LIMITATIONS: community activity and yard work  PERSONAL FACTORS 3+ comorbidities: Chronic left hip pain, vertigo, DM, hard of hearing, OA, and history of prostate cancer  are also affecting patient's functional outcome.   REHAB POTENTIAL: Good  CLINICAL DECISION MAKING: Stable/uncomplicated  EVALUATION COMPLEXITY: Low   GOALS: Goals reviewed with patient? No  LONG TERM GOALS: Target date: 01/15/2022  Patient will be independent with her HEP.  Baseline:  Goal status: INITIAL  2.  Patient will be able to return to golf without being limited by his familiar pain.  Baseline:  Goal status: INITIAL  3.  Patient will be able to walk at least 30 minutes without being limited by his familiar pain. Baseline:  Goal status: INITIAL  PLAN: PT FREQUENCY: 1-2x/week  PT DURATION: 4 weeks  PLANNED INTERVENTIONS: Therapeutic exercises, Therapeutic activity, Neuromuscular re-education, Patient/Family education, Self Care, Joint mobilization, Dry Needling, Electrical stimulation, Spinal mobilization, Cryotherapy, Moist heat, Taping, Traction, Manual therapy, and Re-evaluation.  PLAN FOR NEXT SESSION: nustep, lumbar strengthening, traction, and modalities as needed   Darlin Coco, PT 12/18/2021, 12:45 PM

## 2021-12-20 ENCOUNTER — Ambulatory Visit: Payer: Medicare Other

## 2021-12-20 DIAGNOSIS — M79605 Pain in left leg: Secondary | ICD-10-CM

## 2021-12-20 NOTE — Therapy (Signed)
OUTPATIENT PHYSICAL THERAPY THORACOLUMBAR TREATMENT   Patient Name: Jonathan Washington MRN: [id number] DOB:07-18-1955, 66 y.o., male Today's Date: 12/20/2021   PT End of Session - 12/20/21 1433     Visit Number 2    Number of Visits 8    Date for PT Re-Evaluation 02/15/22    PT Start Time 5277    PT Stop Time 1526    PT Time Calculation (min) 53 min    Activity Tolerance Patient tolerated treatment well    Behavior During Therapy WFL for tasks assessed/performed              Past Medical History:  Diagnosis Date   Cataract    Dyslipidemia    Fatty liver    Fibromyalgia    GERD (gastroesophageal reflux disease)    Glaucoma    RIGHT EYE   Glucose intolerance (impaired glucose tolerance)    Hypertension    Pneumonia    4 YRS AGO   Prostate cancer (HCC)    Vertigo    ESPECIALLY IF PT TURNS HIS HEAD OR MOVES A CERTAIN WAY   Past Surgical History:  Procedure Laterality Date   CHOLECYSTECTOMY     EYE SURGERY Bilateral    cataracts   LEFT KNEE ARTHROSCOPY     LYMPHADENECTOMY  05/27/2012   Procedure: LYMPHADENECTOMY;  Surgeon: Bernestine Amass, MD;  Location: [facility];  Service: Urology;  Laterality: Bilateral;   ROBOT ASSISTED LAPAROSCOPIC RADICAL PROSTATECTOMY  05/27/2012   Procedure: ROBOTIC ASSISTED LAPAROSCOPIC RADICAL PROSTATECTOMY;  Surgeon: Bernestine Amass, MD;  Location: [facility];  Service: Urology;  Laterality: N/A;   SURGERY FOR DEVIATED NASAL SEPTUM     Patient Active Problem List   Diagnosis Date Noted   Low back pain 11/07/2021   Metacarpal bone fracture 12/21/2020   Diabetes mellitus ([place]) 07/27/2020   Gastroesophageal reflux disease 07/27/2020   Hyperlipidemia associated with type 2 diabetes mellitus ([place]) 07/27/2020   H/O prostate cancer 07/27/2020   Arthritis of carpometacarpal (CMC) joint of thumb 12/01/2019   Vestibular hypofunction of left ear 12/17/2018   Malignant neoplasm of prostate ([place]) 08/11/2018   Sensorineural hearing loss (SNHL), bilateral  01/06/2018   Vertigo 01/06/2018   REFERRING PROVIDER: Garald Balding, MD  REFERRING DIAG: Chronic left-sided low back pain with left-sided sciatica  Rationale for Evaluation and Treatment Rehabilitation  THERAPY DIAG:  Pain in left leg  ONSET DATE: approximately 5 weeks ago  SUBJECTIVE:  SUBJECTIVE STATEMENT: Patient reports that he has done a lot of walking today and his back feels good. The only time he has any problems is when he first gets up in the morning, but medication helps resolve that pain.  PERTINENT HISTORY:  Chronic left hip pain, vertigo, DM, hard of hearing, OA, and history of prostate cancer  PAIN:  Are you having pain? Yes: NPRS scale: 0/10 Pain location: left hip and thigh pain Pain description: aching with intermittent sharp pain Aggravating factors: walking for long periods, worst in the morning Relieving factors: injections and pain medication   PRECAUTIONS: None  WEIGHT BEARING RESTRICTIONS No  FALLS:  Has patient fallen in last 6 months? No  LIVING ENVIRONMENT: Lives with: lives with their family Lives in: House/apartment Has following equipment at home: None  OCCUPATION: retired  PLOF: Independent  PATIENT GOALS walker longer   OBJECTIVE: all objective measures were performed on 12/18/21 at his initial evaluation unless otherwise noted  DIAGNOSTIC FINDINGS:  7/24 Lumbar MRI:  IMPRESSION: 1. Generalized lumbar spine degeneration with scoliosis. 2. Notable left foraminal impingement at L4-5. Moderate foraminal narrowing on the right at L3-4 and L4-5. 3. L2-3 small right paracentral herniation contacting the right L3 nerve root. 4. Diffusely patent spinal canal.  SCREENING FOR RED FLAGS: Bowel or bladder incontinence: No Spinal tumors: No Cauda  equina syndrome: No Compression fracture: No Abdominal aneurysm: No  COGNITION:  Overall cognitive status: Within functional limits for tasks assessed     SENSATION: Patient reports tingling in the bottom of both feet since his injection  POSTURE: No Significant postural limitations  PALPATION: TTP: left IT band and quadriceps  LUMBAR ROM:   Active  A/PROM  eval  Flexion 50  Extension 15 recreated familiar left hip pain  Right lateral flexion No limitation  Left lateral flexion No limitation  Right rotation No limitation  Left rotation No limitation   (Blank rows = not tested)  LOWER EXTREMITY ROM: WFL for activities assessed    LOWER EXTREMITY MMT:    MMT Right eval Left eval  Hip flexion 4/5 4/5  Hip extension    Hip abduction    Hip adduction    Hip internal rotation    Hip external rotation    Knee flexion 4+/5 5/5  Knee extension 5/5 5/5  Ankle dorsiflexion 4+/5 4+/5  Ankle plantarflexion    Ankle inversion    Ankle eversion     (Blank rows = not tested)  LUMBAR SPECIAL TESTS:  FABER test: Positive  GAIT: Assistive device utilized: None Level of assistance: Complete Independence  TODAY'S TREATMENT                                    8/31 EXERCISE LOG  Exercise Repetitions and Resistance Comments  Nustep L4-5 x 10 minutes   Eccentric heel tap  6" step; 25 reps   Pull down Blue XTS x 30 reps    Wood chops  Orange XTS x 30 reps each   Ball roll out  Multidirectional; 20 reps each Added to HEP   Blank cell = exercise not performed today  Modalities  Date:  Traction: lumbar, 5# min (5 second hold) - 70# max (99 second hold), 15 mins mins  8/29 EXERCISE LOG  Exercise Repetitions and Resistance Comments  Lower trunk rotation 10 reps Dull ache with LTR to the right  Bridge 10 reps   SLR  10 reps   Standing hip ABD  10 reps        Blank cell = exercise not performed today    PATIENT EDUCATION:  Education  details: traction Person educated: Patient Education method: Explanation Education comprehension: verbalized understanding   HOME EXERCISE PROGRAM: [id number]  ASSESSMENT:  CLINICAL IMPRESSION: Patient was introduced to multiple new interventions for improved lumbar stability needed for improved function with activities such as golfing. He required minimal cueing with today's new interventions for proper exercise performance. He experienced no pain with any of today's interventions. His HEP was updated with ball roll outs for reduced soreness and improved lumbar mobility. He reported feeling comfortable with this intervention. He reported that he was not having any pain upon the conclusion of treatment. He continues to require skilled physical therapy to address his remaining impairments to return to his prior level of function.    OBJECTIVE IMPAIRMENTS decreased activity tolerance, decreased mobility, difficulty walking, decreased ROM, decreased strength, and pain.   ACTIVITY LIMITATIONS locomotion level  PARTICIPATION LIMITATIONS: community activity and yard work  PERSONAL FACTORS 3+ comorbidities: Chronic left hip pain, vertigo, DM, hard of hearing, OA, and history of prostate cancer  are also affecting patient's functional outcome.   REHAB POTENTIAL: Good  CLINICAL DECISION MAKING: Stable/uncomplicated  EVALUATION COMPLEXITY: Low   GOALS: Goals reviewed with patient? No  LONG TERM GOALS: Target date: 01/15/2022  Patient will be independent with her HEP.  Baseline:  Goal status: INITIAL  2.  Patient will be able to return to golf without being limited by his familiar pain.  Baseline:  Goal status: INITIAL  3.  Patient will be able to walk at least 30 minutes without being limited by his familiar pain. Baseline:  Goal status: INITIAL  PLAN: PT FREQUENCY: 1-2x/week  PT DURATION: 4 weeks  PLANNED INTERVENTIONS: Therapeutic exercises, Therapeutic activity,  Neuromuscular re-education, Patient/Family education, Self Care, Joint mobilization, Dry Needling, Electrical stimulation, Spinal mobilization, Cryotherapy, Moist heat, Taping, Traction, Manual therapy, and Re-evaluation.  PLAN FOR NEXT SESSION: nustep, lumbar strengthening, traction, and modalities as needed   Darlin Coco, PT 12/20/2021, 3:48 PM

## 2021-12-25 ENCOUNTER — Encounter: Payer: Self-pay | Admitting: *Deleted

## 2021-12-25 ENCOUNTER — Ambulatory Visit: Payer: Medicare Other | Attending: Orthopaedic Surgery | Admitting: *Deleted

## 2021-12-25 DIAGNOSIS — M79605 Pain in left leg: Secondary | ICD-10-CM | POA: Insufficient documentation

## 2021-12-25 NOTE — Therapy (Signed)
OUTPATIENT PHYSICAL THERAPY THORACOLUMBAR TREATMENT   Patient Name: Jonathan Washington MRN: [id number] DOB:April 03, 1956, 66 y.o., male Today's Date: 12/25/2021   PT End of Session - 12/25/21 0945     Visit Number 3    Number of Visits 8    Date for PT Re-Evaluation 02/15/22    PT Start Time 0945              Past Medical History:  Diagnosis Date   Cataract    Dyslipidemia    Fatty liver    Fibromyalgia    GERD (gastroesophageal reflux disease)    Glaucoma    RIGHT EYE   Glucose intolerance (impaired glucose tolerance)    Hypertension    Pneumonia    4 YRS AGO   Prostate cancer ([place])    Vertigo    ESPECIALLY IF PT TURNS HIS HEAD OR MOVES A CERTAIN WAY   Past Surgical History:  Procedure Laterality Date   CHOLECYSTECTOMY     EYE SURGERY Bilateral    cataracts   LEFT KNEE ARTHROSCOPY     LYMPHADENECTOMY  05/27/2012   Procedure: LYMPHADENECTOMY;  Surgeon: Bernestine Amass, MD;  Location: [facility];  Service: Urology;  Laterality: Bilateral;   ROBOT ASSISTED LAPAROSCOPIC RADICAL PROSTATECTOMY  05/27/2012   Procedure: ROBOTIC ASSISTED LAPAROSCOPIC RADICAL PROSTATECTOMY;  Surgeon: Bernestine Amass, MD;  Location: [facility];  Service: Urology;  Laterality: N/A;   SURGERY FOR DEVIATED NASAL SEPTUM     Patient Active Problem List   Diagnosis Date Noted   Low back pain 11/07/2021   Metacarpal bone fracture 12/21/2020   Diabetes mellitus ([place]) 07/27/2020   Gastroesophageal reflux disease 07/27/2020   Hyperlipidemia associated with type 2 diabetes mellitus ([place]) 07/27/2020   H/O prostate cancer 07/27/2020   Arthritis of carpometacarpal (CMC) joint of thumb 12/01/2019   Vestibular hypofunction of left ear 12/17/2018   Malignant neoplasm of prostate ([place]) 08/11/2018   Sensorineural hearing loss (SNHL), bilateral 01/06/2018   Vertigo 01/06/2018   REFERRING PROVIDER: Garald Balding, MD  REFERRING DIAG: Chronic left-sided low back pain with left-sided sciatica  Rationale for  Evaluation and Treatment Rehabilitation  THERAPY DIAG:  Pain in left leg  ONSET DATE: approximately 5 weeks ago  SUBJECTIVE:                                                                                                                                                                                           SUBJECTIVE STATEMENT:   Pt reports doing better today. No meds today. No traction today. PERTINENT HISTORY:  Chronic left hip pain, vertigo, DM, hard of hearing, OA, and  history of prostate cancer  PAIN:  Are you having pain? Yes: NPRS scale: 1/10 Pain location: left hip and thigh pain Pain description: aching with intermittent sharp pain Aggravating factors: walking for long periods, worst in the morning Relieving factors: injections and pain medication   PRECAUTIONS: None  WEIGHT BEARING RESTRICTIONS No  FALLS:  Has patient fallen in last 6 months? No  LIVING ENVIRONMENT: Lives with: lives with their family Lives in: House/apartment Has following equipment at home: None  OCCUPATION: retired  PLOF: Independent  PATIENT GOALS walker longer   OBJECTIVE: all objective measures were performed on 12/18/21 at his initial evaluation unless otherwise noted  DIAGNOSTIC FINDINGS:  7/24 Lumbar MRI:  IMPRESSION: 1. Generalized lumbar spine degeneration with scoliosis. 2. Notable left foraminal impingement at L4-5. Moderate foraminal narrowing on the right at L3-4 and L4-5. 3. L2-3 small right paracentral herniation contacting the right L3 nerve root. 4. Diffusely patent spinal canal.  SCREENING FOR RED FLAGS: Bowel or bladder incontinence: No Spinal tumors: No Cauda equina syndrome: No Compression fracture: No Abdominal aneurysm: No  COGNITION:  Overall cognitive status: Within functional limits for tasks assessed     SENSATION: Patient reports tingling in the bottom of both feet since his injection  POSTURE: No Significant postural  limitations  PALPATION: TTP: left IT band and quadriceps  LUMBAR ROM:   Active  A/PROM  eval  Flexion 50  Extension 15 recreated familiar left hip pain  Right lateral flexion No limitation  Left lateral flexion No limitation  Right rotation No limitation  Left rotation No limitation   (Blank rows = not tested)  LOWER EXTREMITY ROM: WFL for activities assessed    LOWER EXTREMITY MMT:    MMT Right eval Left eval  Hip flexion 4/5 4/5  Hip extension    Hip abduction    Hip adduction    Hip internal rotation    Hip external rotation    Knee flexion 4+/5 5/5  Knee extension 5/5 5/5  Ankle dorsiflexion 4+/5 4+/5  Ankle plantarflexion    Ankle inversion    Ankle eversion     (Blank rows = not tested)  LUMBAR SPECIAL TESTS:  FABER test: Positive  GAIT: Assistive device utilized: None Level of assistance: Complete Independence  TODAY'S TREATMENT                                    12-25-21  EXERCISE LOG  Exercise Repetitions and Resistance Comments  Nustep L4-5 x 10 minutes   Eccentric heel tap     Pull down Blue XTS x 15 reps hold 5 secs   Wood chops     Ball roll out     AB brace with Bridge X10 hold 5 secs   AB brace with marching X6 hold 5 secs each side   Dying bug  X 6 hold 5 secs each side        Blank cell = exercise not performed today  Discussed ADL's and AB bracing with transitional movements. Practiced in/out of bed and rolling to side  with  log rolls. Discussed other transitional movements in /out of car as well. Pt also practiced getting in/out of the floor and did well without increased pain  Modalities  Date:  8/29 EXERCISE LOG  Exercise Repetitions and Resistance Comments  Lower trunk rotation 10 reps Dull ache with LTR to the right  Bridge 10 reps   SLR  10 reps   Standing hip ABD  10 reps        Blank cell = exercise not performed today    PATIENT EDUCATION:  Education details: traction Person  educated: Patient Education method: Explanation Education comprehension: verbalized understanding   HOME EXERCISE PROGRAM: [id number]  ASSESSMENT:  CLINICAL IMPRESSION:    Pt arrived today reporting doing very well with minimal LB and LT leg pain.   OBJECTIVE IMPAIRMENTS decreased activity tolerance, decreased mobility, difficulty walking, decreased ROM, decreased strength, and pain.   ACTIVITY LIMITATIONS locomotion level  PARTICIPATION LIMITATIONS: community activity and yard work  PERSONAL FACTORS 3+ comorbidities: Chronic left hip pain, vertigo, DM, hard of hearing, OA, and history of prostate cancer  are also affecting patient's functional outcome.   REHAB POTENTIAL: Good  CLINICAL DECISION MAKING: Stable/uncomplicated  EVALUATION COMPLEXITY: Low   GOALS: Goals reviewed with patient? No  LONG TERM GOALS: Target date: 01/15/2022  Patient will be independent with her HEP.  Baseline:  Goal status: INITIAL  2.  Patient will be able to return to golf without being limited by his familiar pain.  Baseline:  Goal status: INITIAL  3.  Patient will be able to walk at least 30 minutes without being limited by his familiar pain. Baseline:  Goal status: INITIAL  PLAN: PT FREQUENCY: 1-2x/week  PT DURATION: 4 weeks  PLANNED INTERVENTIONS: Therapeutic exercises, Therapeutic activity, Neuromuscular re-education, Patient/Family education, Self Care, Joint mobilization, Dry Needling, Electrical stimulation, Spinal mobilization, Cryotherapy, Moist heat, Taping, Traction, Manual therapy, and Re-evaluation.  PLAN FOR NEXT SESSION: nustep, lumbar strengthening, traction, and modalities as needed   Kenston Longton,CHRIS, PTA 12/25/2021, 9:47 AM

## 2021-12-27 ENCOUNTER — Encounter: Payer: Self-pay | Admitting: *Deleted

## 2021-12-27 ENCOUNTER — Ambulatory Visit: Payer: Medicare Other | Admitting: *Deleted

## 2021-12-27 DIAGNOSIS — M79605 Pain in left leg: Secondary | ICD-10-CM

## 2021-12-27 NOTE — Therapy (Signed)
OUTPATIENT PHYSICAL THERAPY THORACOLUMBAR TREATMENT   Patient Name: Jonathan Washington HIPPERT MRN: [id number] DOB:[birth year], 66 y.o., male Today's Date: 12/27/2021   PT End of Session - 12/27/21 1258     Visit Number 4    Number of Visits 8    Date for PT Re-Evaluation 02/15/22    PT Start Time 1300    PT Stop Time 2703    PT Time Calculation (min) 47 min              Past Medical History:  Diagnosis Date   Cataract    Dyslipidemia    Fatty liver    Fibromyalgia    GERD (gastroesophageal reflux disease)    Glaucoma    RIGHT EYE   Glucose intolerance (impaired glucose tolerance)    Hypertension    Pneumonia    4 YRS AGO   Prostate cancer ([facility])    Vertigo    ESPECIALLY IF PT TURNS HIS HEAD OR MOVES A CERTAIN WAY   Past Surgical History:  Procedure Laterality Date   CHOLECYSTECTOMY     EYE SURGERY Bilateral    cataracts   LEFT KNEE ARTHROSCOPY     LYMPHADENECTOMY  05/27/2012   Procedure: LYMPHADENECTOMY;  Surgeon: Bernestine Amass, MD;  [facility];  Service: Urology;  Laterality: Bilateral;   ROBOT ASSISTED LAPAROSCOPIC RADICAL PROSTATECTOMY  05/27/2012   Procedure: ROBOTIC ASSISTED LAPAROSCOPIC RADICAL PROSTATECTOMY;  Surgeon: Bernestine Amass, MD;  [facility];  Service: Urology;  Laterality: N/A;   SURGERY FOR DEVIATED NASAL SEPTUM     Patient Active Problem List   Diagnosis Date Noted   Low back pain 11/07/2021   Metacarpal bone fracture 12/21/2020   Diabetes mellitus ([place]) 07/27/2020   Gastroesophageal reflux disease 07/27/2020   Hyperlipidemia associated with type 2 diabetes mellitus ([place]) 07/27/2020   H/O prostate cancer 07/27/2020   Arthritis of carpometacarpal (CMC) joint of thumb 12/01/2019   Vestibular hypofunction of left ear 12/17/2018   Malignant neoplasm of prostate ([place]) 08/11/2018   Sensorineural hearing loss (SNHL), bilateral 01/06/2018   Vertigo 01/06/2018   REFERRING PROVIDER: Garald Balding, MD  REFERRING DIAG: Chronic left-sided  low back pain with left-sided sciatica  Rationale for Evaluation and Treatment Rehabilitation  THERAPY DIAG:  Pain in left leg  ONSET DATE: approximately 5 weeks ago  SUBJECTIVE:                                                                                                                                                                                           SUBJECTIVE STATEMENT:   Pt reports doing good today. Mainly mid-back mm soreness  PERTINENT HISTORY:  Chronic left hip pain, vertigo, DM, hard of hearing, OA, and history of prostate cancer  PAIN:  Are you having pain? Yes: NPRS scale: 2/10 Pain location: left hip and thigh pain Pain description: aching with intermittent sharp pain Aggravating factors: walking for long periods, worst in the morning Relieving factors: injections and pain medication   PRECAUTIONS: None  WEIGHT BEARING RESTRICTIONS No  FALLS:  Has patient fallen in last 6 months? No  LIVING ENVIRONMENT: Lives with: lives with their family Lives in: House/apartment Has following equipment at home: None  OCCUPATION: retired  PLOF: Independent  PATIENT GOALS walker longer   OBJECTIVE: all objective measures were performed on 12/18/21 at his initial evaluation unless otherwise noted  DIAGNOSTIC FINDINGS:  7/24 Lumbar MRI:  IMPRESSION: 1. Generalized lumbar spine degeneration with scoliosis. 2. Notable left foraminal impingement at L4-5. Moderate foraminal narrowing on the right at L3-4 and L4-5. 3. L2-3 small right paracentral herniation contacting the right L3 nerve root. 4. Diffusely patent spinal canal.  SCREENING FOR RED FLAGS: Bowel or bladder incontinence: No Spinal tumors: No Cauda equina syndrome: No Compression fracture: No Abdominal aneurysm: No  COGNITION:  Overall cognitive status: Within functional limits for tasks assessed     SENSATION: Patient reports tingling in the bottom of both feet since his injection  POSTURE: No  Significant postural limitations  PALPATION: TTP: left IT band and quadriceps  LUMBAR ROM:   Active  A/PROM  eval  Flexion 50  Extension 15 recreated familiar left hip pain  Right lateral flexion No limitation  Left lateral flexion No limitation  Right rotation No limitation  Left rotation No limitation   (Blank rows = not tested)  LOWER EXTREMITY ROM: WFL for activities assessed    LOWER EXTREMITY MMT:    MMT Right eval Left eval  Hip flexion 4/5 4/5  Hip extension    Hip abduction    Hip adduction    Hip internal rotation    Hip external rotation    Knee flexion 4+/5 5/5  Knee extension 5/5 5/5  Ankle dorsiflexion 4+/5 4+/5  Ankle plantarflexion    Ankle inversion    Ankle eversion     (Blank rows = not tested)  LUMBAR SPECIAL TESTS:  FABER test: Positive  GAIT: Assistive device utilized: None Level of assistance: Complete Independence  TODAY'S TREATMENT                                    12-27-21  EXERCISE LOG  Exercise Repetitions and Resistance Comments  Nustep L4-5 x 11 minutes   Eccentric heel tap     Pull down Blue XTS 10reps hold 10 secs   ROWS Blue XTS  2x10   hold 5 secs       AB brace with Bridge X10 hold 5 secs   AB brace with marching    Dying bug  X 6 hold 5 secs each side   Modified standing bird dog Leg raise x 6 each side hold 5 sec   Standing bird dog at counter Arm and opp.leg raise x 6 hold 5 sec each    Blank cell = exercise not performed today  Discussed HEP and added standing bird dog at counter with handout Date:  8/29 EXERCISE LOG  Exercise Repetitions and Resistance Comments  Lower trunk rotation 10 reps Dull ache with LTR to the right  Bridge 10 reps   SLR  10 reps   Standing hip ABD  10 reps        Blank cell = exercise not performed today    PATIENT EDUCATION:  Education details: traction Person educated: Patient Education method: Explanation Education comprehension:  verbalized understanding   HOME EXERCISE PROGRAM: [id number]  ASSESSMENT:  CLINICAL IMPRESSION:  Pt arrived today still doing well with mainly mm soreness. He reports doing better getting on/off tractor at home and other transitions.Rx focused on core strengthening exs again with added standing bird-dog. Handout given for HEP with good demonstration  Pt arrived today reporting doing very well with minimal LB and LT leg pain.   OBJECTIVE IMPAIRMENTS decreased activity tolerance, decreased mobility, difficulty walking, decreased ROM, decreased strength, and pain.   ACTIVITY LIMITATIONS locomotion level  PARTICIPATION LIMITATIONS: community activity and yard work  PERSONAL FACTORS 3+ comorbidities: Chronic left hip pain, vertigo, DM, hard of hearing, OA, and history of prostate cancer  are also affecting patient's functional outcome.   REHAB POTENTIAL: Good  CLINICAL DECISION MAKING: Stable/uncomplicated  EVALUATION COMPLEXITY: Low   GOALS: Goals reviewed with patient? No  LONG TERM GOALS: Target date: 01/15/2022  Patient will be independent with her HEP.  Baseline:  Goal status: INITIAL  2.  Patient will be able to return to golf without being limited by his familiar pain.  Baseline:  Goal status: INITIAL  3.  Patient will be able to walk at least 30 minutes without being limited by his familiar pain. Baseline:  Goal status: INITIAL  PLAN: PT FREQUENCY: 1-2x/week  PT DURATION: 4 weeks  PLANNED INTERVENTIONS: Therapeutic exercises, Therapeutic activity, Neuromuscular re-education, Patient/Family education, Self Care, Joint mobilization, Dry Needling, Electrical stimulation, Spinal mobilization, Cryotherapy, Moist heat, Taping, Traction, Manual therapy, and Re-evaluation.  PLAN FOR NEXT SESSION: nustep, lumbar strengthening,and modalities as needed   Kemper Heupel,CHRIS, PTA 12/27/2021, 2:58 PM

## 2022-01-02 ENCOUNTER — Ambulatory Visit: Payer: Medicare Other | Admitting: Physical Therapy

## 2022-01-02 DIAGNOSIS — M79605 Pain in left leg: Secondary | ICD-10-CM | POA: Diagnosis not present

## 2022-01-02 NOTE — Therapy (Signed)
OUTPATIENT PHYSICAL THERAPY THORACOLUMBAR TREATMENT   Patient Name: Jonathan Washington MRN: [id number] DOB:04-Apr-1956, 66 y.o., male Today's Date: 01/02/2022   PT End of Session - 01/02/22 1315     Visit Number 5    Number of Visits 8    Date for PT Re-Evaluation 02/15/22    PT Start Time 1230    PT Stop Time 1310    PT Time Calculation (min) 40 min    Activity Tolerance Patient tolerated treatment well    Behavior During Therapy WFL for tasks assessed/performed              Past Medical History:  Diagnosis Date   Cataract    Dyslipidemia    Fatty liver    Fibromyalgia    GERD (gastroesophageal reflux disease)    Glaucoma    RIGHT EYE   Glucose intolerance (impaired glucose tolerance)    Hypertension    Pneumonia    4 YRS AGO   Prostate cancer ([facility])    Vertigo    ESPECIALLY IF PT TURNS HIS HEAD OR MOVES A CERTAIN WAY   Past Surgical History:  Procedure Laterality Date   CHOLECYSTECTOMY     EYE SURGERY Bilateral    cataracts   LEFT KNEE ARTHROSCOPY     LYMPHADENECTOMY  05/27/2012   Procedure: LYMPHADENECTOMY;  Surgeon: Bernestine Amass, MD;  [facility]: [facility];  Service: Urology;  Laterality: Bilateral;   ROBOT ASSISTED LAPAROSCOPIC RADICAL PROSTATECTOMY  05/27/2012   Procedure: ROBOTIC ASSISTED LAPAROSCOPIC RADICAL PROSTATECTOMY;  Surgeon: Bernestine Amass, MD;  [facility]: [facility];  Service: Urology;  Laterality: N/A;   SURGERY FOR DEVIATED NASAL SEPTUM     Patient Active Problem List   Diagnosis Date Noted   Low back pain 11/07/2021   Metacarpal bone fracture 12/21/2020   Diabetes mellitus ([place]) 07/27/2020   Gastroesophageal reflux disease 07/27/2020   Hyperlipidemia associated with type 2 diabetes mellitus ([place]) 07/27/2020   H/O prostate cancer 07/27/2020   Arthritis of carpometacarpal (CMC) joint of thumb 12/01/2019   Vestibular hypofunction of left ear 12/17/2018   Malignant neoplasm of prostate ([place]) 08/11/2018   Sensorineural hearing loss (SNHL), bilateral  01/06/2018   Vertigo 01/06/2018   REFERRING PROVIDER: Garald Balding, MD  REFERRING DIAG: Chronic left-sided low back pain with left-sided sciatica  Rationale for Evaluation and Treatment Rehabilitation  THERAPY DIAG:  Pain in left leg  ONSET DATE: approximately 5 weeks ago  SUBJECTIVE:  SUBJECTIVE STATEMENT:   Pt reports doing good today. C/o some left low back mm soreness PERTINENT HISTORY:  Chronic left hip pain, vertigo, DM, hard of hearing, OA, and history of prostate cancer  PAIN:  Are you having pain? Yes: NPRS scale: 2/10 Pain location: left hip and thigh pain Pain description: aching with intermittent sharp pain Aggravating factors: walking for long periods, worst in the morning Relieving factors: injections and pain medication    PATIENT GOALS walker longer   OBJECTIVE:   TODAY'S TREATMENT                                    01-02-22  EXERCISE LOG  Exercise Repetitions and Resistance Comments  Nustep L4 x 13 minutes   Back Ext 70# 3 minutes.   Ham curls 3 minutes.       Manual:  Patient in right side-lying position:  STW/M x 11 minutes to patient's left lower lumbar musculature including ischemic release technique to his QL.       ASSESSMENT:  CLINICAL IMPRESSION:  Pt arrived today still doing well with mainly mm soreness. He was found to have increased tone over his left QL.  He did very well with STW/M and felt good after treatment.  We discussed releasing his lumbar trigger points with a theracane and/or thumb guard.   GOALS: Goals reviewed with patient? No  LONG TERM GOALS: Target date: 01/15/2022  Patient will be independent with her HEP.  Baseline:  Goal status: INITIAL  2.  Patient will be able to return to golf without being limited by his familiar pain.   Baseline:  Goal status: INITIAL  3.  Patient will be able to walk at least 30 minutes without being limited by his familiar pain. Baseline:  Goal status: INITIAL  PLAN: PT FREQUENCY: 1-2x/week  PT DURATION: 4 weeks  PLANNED INTERVENTIONS: Therapeutic exercises, Therapeutic activity, Neuromuscular re-education, Patient/Family education, Self Care, Joint mobilization, Dry Needling, Electrical stimulation, Spinal mobilization, Cryotherapy, Moist heat, Taping, Traction, Manual therapy, and Re-evaluation.  PLAN FOR NEXT SESSION: nustep, lumbar strengthening,and modalities as needed   Chakira Jachim, [place], PT 01/02/2022, 1:17 PM

## 2022-01-09 ENCOUNTER — Ambulatory Visit: Payer: Medicare Other

## 2022-01-09 DIAGNOSIS — M79605 Pain in left leg: Secondary | ICD-10-CM

## 2022-01-09 NOTE — Therapy (Signed)
OUTPATIENT PHYSICAL THERAPY THORACOLUMBAR TREATMENT   Patient Name: Jonathan Washington MRN: [id number] DOB:February 27, 1956, 66 y.o., male Today's Date: 01/09/2022   PT End of Session - 01/09/22 1352     Visit Number 6    Number of Visits 8    Date for PT Re-Evaluation 02/15/22    PT Start Time 9794    PT Stop Time 1430    PT Time Calculation (min) 45 min    Activity Tolerance Patient tolerated treatment well    Behavior During Therapy WFL for tasks assessed/performed              Past Medical History:  Diagnosis Date   Cataract    Dyslipidemia    Fatty liver    Fibromyalgia    GERD (gastroesophageal reflux disease)    Glaucoma    RIGHT EYE   Glucose intolerance (impaired glucose tolerance)    Hypertension    Pneumonia    4 YRS AGO   Prostate cancer ([facility])    Vertigo    ESPECIALLY IF PT TURNS HIS HEAD OR MOVES A CERTAIN WAY   Past Surgical History:  Procedure Laterality Date   CHOLECYSTECTOMY     EYE SURGERY Bilateral    cataracts   LEFT KNEE ARTHROSCOPY     LYMPHADENECTOMY  05/27/2012   Procedure: LYMPHADENECTOMY;  Surgeon: Bernestine Amass, MD;  [facility]: [facility];  Service: Urology;  Laterality: Bilateral;   ROBOT ASSISTED LAPAROSCOPIC RADICAL PROSTATECTOMY  05/27/2012   Procedure: ROBOTIC ASSISTED LAPAROSCOPIC RADICAL PROSTATECTOMY;  Surgeon: Bernestine Amass, MD;  Location: [facility];  Service: Urology;  Laterality: N/A;   SURGERY FOR DEVIATED NASAL SEPTUM     Patient Active Problem List   Diagnosis Date Noted   Low back pain 11/07/2021   Metacarpal bone fracture 12/21/2020   Diabetes mellitus ([place]) 07/27/2020   Gastroesophageal reflux disease 07/27/2020   Hyperlipidemia associated with type 2 diabetes mellitus ([place]) 07/27/2020   H/O prostate cancer 07/27/2020   Arthritis of carpometacarpal (CMC) joint of thumb 12/01/2019   Vestibular hypofunction of left ear 12/17/2018   Malignant neoplasm of prostate ([place]) 08/11/2018   Sensorineural hearing loss (SNHL), bilateral  01/06/2018   Vertigo 01/06/2018   REFERRING PROVIDER: Garald Balding, MD  REFERRING DIAG: Chronic left-sided low back pain with left-sided sciatica  Rationale for Evaluation and Treatment Rehabilitation  THERAPY DIAG:  Pain in left leg  ONSET DATE: approximately 5 weeks ago  SUBJECTIVE:  SUBJECTIVE STATEMENT:   Pt reports doing good today. C/o some left low back mm soreness PERTINENT HISTORY:  Chronic left hip pain, vertigo, DM, hard of hearing, OA, and history of prostate cancer  PAIN:  Are you having pain? Yes: NPRS scale: 2/10 Pain location: left hip and thigh pain Pain description: aching with intermittent sharp pain Aggravating factors: walking for long periods, worst in the morning Relieving factors: injections and pain medication    PATIENT GOALS walker longer   OBJECTIVE:   TODAY'S TREATMENT                                    01-09-22  EXERCISE LOG  Exercise Repetitions and Resistance Comments  Nustep L4 x 15 minutes   Lumbar Extension 80# 3 minutes.   Cybex Knee Flexion 50# x 30 reps   Cybex Knee Extension 20# x 30 mins   Lumbar Flexion 80# x 3 mins   Manual:  Patient in right side-lying position:   to patient's left lower lumbar musculature including ischemic release technique to his QL.   ASSESSMENT:  CLINICAL IMPRESSION:  Pt arrives for today's treatment session reporting minimal lower back and LLE soreness.  Pt able to tolerate introduction to lumber flexion as well as cybex knee flexion and extension without issue and good results.  STW/TPR performed to left low back and QL to decrease pain and tone.  Pt reports feeling 95% better at this time.  Pt denied any increase in pain at completion of today's treatment session.    GOALS: Goals reviewed with patient?  No  LONG TERM GOALS: Target date: 01/15/2022  Patient will be independent with her HEP.  Baseline:  Goal status: MET  2.  Patient will be able to return to golf without being limited by his familiar pain.  Baseline:  Goal status: IN PROGRESS  3.  Patient will be able to walk at least 30 minutes without being limited by his familiar pain. Baseline:  Goal status: MET  PLAN: PT FREQUENCY: 1-2x/week  PT DURATION: 4 weeks  PLANNED INTERVENTIONS: Therapeutic exercises, Therapeutic activity, Neuromuscular re-education, Patient/Family education, Self Care, Joint mobilization, Dry Needling, Electrical stimulation, Spinal mobilization, Cryotherapy, Moist heat, Taping, Traction, Manual therapy, and Re-evaluation.  PLAN FOR NEXT SESSION: nustep, lumbar strengthening,and modalities as needed   Kathrynn Ducking, PTA 01/09/2022, 2:38 PM

## 2022-01-11 ENCOUNTER — Ambulatory Visit: Payer: Medicare Other

## 2022-01-11 DIAGNOSIS — M79605 Pain in left leg: Secondary | ICD-10-CM | POA: Diagnosis not present

## 2022-01-11 NOTE — Therapy (Addendum)
OUTPATIENT PHYSICAL THERAPY THORACOLUMBAR TREATMENT   Patient Name: Jonathan Washington MRN: [id number] DOB:Mar 28, 1956, 66 y.o., male Today's Date: 01/11/2022   PT End of Session - 01/11/22 0908     Visit Number 7    Number of Visits 8    Date for PT Re-Evaluation 02/15/22    PT Start Time 0900    PT Stop Time 0944    PT Time Calculation (min) 44 min    Activity Tolerance Patient tolerated treatment well    Behavior During Therapy WFL for tasks assessed/performed              Past Medical History:  Diagnosis Date   Cataract    Dyslipidemia    Fatty liver    Fibromyalgia    GERD (gastroesophageal reflux disease)    Glaucoma    RIGHT EYE   Glucose intolerance (impaired glucose tolerance)    Hypertension    Pneumonia    4 YRS AGO   Prostate cancer (HCC)    Vertigo    ESPECIALLY IF PT TURNS HIS HEAD OR MOVES A CERTAIN WAY   Past Surgical History:  Procedure Laterality Date   CHOLECYSTECTOMY     EYE SURGERY Bilateral    cataracts   LEFT KNEE ARTHROSCOPY     LYMPHADENECTOMY  05/27/2012   Procedure: LYMPHADENECTOMY;  Surgeon: Bernestine Amass, MD;  Location: [facility];  Service: Urology;  Laterality: Bilateral;   ROBOT ASSISTED LAPAROSCOPIC RADICAL PROSTATECTOMY  05/27/2012   Procedure: ROBOTIC ASSISTED LAPAROSCOPIC RADICAL PROSTATECTOMY;  Surgeon: Bernestine Amass, MD;  Location: [facility];  Service: Urology;  Laterality: N/A;   SURGERY FOR DEVIATED NASAL SEPTUM     Patient Active Problem List   Diagnosis Date Noted   Low back pain 11/07/2021   Metacarpal bone fracture 12/21/2020   Diabetes mellitus ([place]) 07/27/2020   Gastroesophageal reflux disease 07/27/2020   Hyperlipidemia associated with type 2 diabetes mellitus ([place]) 07/27/2020   H/O prostate cancer 07/27/2020   Arthritis of carpometacarpal (CMC) joint of thumb 12/01/2019   Vestibular hypofunction of left ear 12/17/2018   Malignant neoplasm of prostate ([place]) 08/11/2018   Sensorineural hearing loss (SNHL), bilateral  01/06/2018   Vertigo 01/06/2018   REFERRING PROVIDER: Garald Balding, MD  REFERRING DIAG: Chronic left-sided low back pain with left-sided sciatica  Rationale for Evaluation and Treatment Rehabilitation  THERAPY DIAG:  Pain in left leg  ONSET DATE: approximately 5 weeks ago  SUBJECTIVE:  SUBJECTIVE STATEMENT:   Pt arrives for today's treatment session reporting left LE soreness, but feels good.  Pt reports working a lot yesterday around the house as well as hitting golf balls without issues.  PERTINENT HISTORY:  Chronic left hip pain, vertigo, DM, hard of hearing, OA, and history of prostate cancer  PAIN:  Are you having pain? Yes: NPRS scale: soreness/10 Pain location: left hip and thigh pain Pain description: aching with intermittent sharp pain Aggravating factors: walking for long periods, worst in the morning Relieving factors: injections and pain medication    PATIENT GOALS walker longer   OBJECTIVE:   TODAY'S TREATMENT                                  01-11-22  EXERCISE LOG  Exercise Repetitions and Resistance Comments  Nustep L4 x 15 minutes   Lumbar Extension 80# 3 minutes.   Cybex Knee Flexion 50# x 30 reps   Cybex Knee Extension 20# x 30 reps   Lumbar Flexion 80# x 3 mins    Manual:  Patient in right side-lying position:   to patient's left lower lumbar musculature including ischemic release technique to his QL.   ASSESSMENT:  CLINICAL IMPRESSION:  Pt arrives for today's treatment session reporting left LE soreness, but does not give it a number.  Pt reports performing numerous household chores yesterday without L LE pain.  Pt also able to hit several golf balls without pain or concern.  All questions encouraged and answered today.  Pt put on hold at this time and  instructed to call facility in two weeks.   PHYSICAL THERAPY DISCHARGE SUMMARY  Visits from Start of Care: 7  Current functional level related to goals / functional outcomes: Patient was able to meet all of his goals for skilled physical therapy.    Remaining deficits: None    Education / Equipment: HEP    Patient agrees to discharge. Patient goals were met. Patient is being discharged due to meeting the stated rehab goals.  Jacqulynn Cadet, PT, DPT    GOALS: Goals reviewed with patient? No  LONG TERM GOALS: Target date: 01/15/2022  Patient will be independent with her HEP.  Baseline:  Goal status: MET  2.  Patient will be able to return to golf without being limited by his familiar pain.  Baseline:  Goal status: MET  3.  Patient will be able to walk at least 30 minutes without being limited by his familiar pain. Baseline:  Goal status: MET  PLAN: PT FREQUENCY: 1-2x/week  PT DURATION: 4 weeks  PLANNED INTERVENTIONS: Therapeutic exercises, Therapeutic activity, Neuromuscular re-education, Patient/Family education, Self Care, Joint mobilization, Dry Needling, Electrical stimulation, Spinal mobilization, Cryotherapy, Moist heat, Taping, Traction, Manual therapy, and Re-evaluation.  PLAN FOR NEXT SESSION: nustep, lumbar strengthening,and modalities as needed   Kathrynn Ducking, PTA 01/11/2022, 9:44 AM

## 2022-01-25 ENCOUNTER — Ambulatory Visit: Payer: Medicare Other | Admitting: Family

## 2022-01-25 ENCOUNTER — Other Ambulatory Visit: Payer: Self-pay | Admitting: Family

## 2022-02-04 ENCOUNTER — Ambulatory Visit (INDEPENDENT_AMBULATORY_CARE_PROVIDER_SITE_OTHER): Payer: Medicare Other | Admitting: Family

## 2022-02-04 ENCOUNTER — Encounter: Payer: Self-pay | Admitting: Family

## 2022-02-04 VITALS — BP 120/77 | HR 82 | Temp 97.0°F | Ht 71.0 in | Wt 195.0 lb

## 2022-02-04 DIAGNOSIS — C61 Malignant neoplasm of prostate: Secondary | ICD-10-CM | POA: Diagnosis not present

## 2022-02-04 DIAGNOSIS — K219 Gastro-esophageal reflux disease without esophagitis: Secondary | ICD-10-CM

## 2022-02-04 DIAGNOSIS — M1810 Unilateral primary osteoarthritis of first carpometacarpal joint, unspecified hand: Secondary | ICD-10-CM | POA: Diagnosis not present

## 2022-02-04 DIAGNOSIS — Z794 Long term (current) use of insulin: Secondary | ICD-10-CM

## 2022-02-04 DIAGNOSIS — E1169 Type 2 diabetes mellitus with other specified complication: Secondary | ICD-10-CM

## 2022-02-04 DIAGNOSIS — M5442 Lumbago with sciatica, left side: Secondary | ICD-10-CM

## 2022-02-04 DIAGNOSIS — E785 Hyperlipidemia, unspecified: Secondary | ICD-10-CM

## 2022-02-04 DIAGNOSIS — E781 Pure hyperglyceridemia: Secondary | ICD-10-CM

## 2022-02-04 DIAGNOSIS — G8929 Other chronic pain: Secondary | ICD-10-CM

## 2022-02-04 DIAGNOSIS — Z8546 Personal history of malignant neoplasm of prostate: Secondary | ICD-10-CM

## 2022-02-04 MED ORDER — ICOSAPENT ETHYL 1 G PO CAPS: 2.0000 g | ORAL_CAPSULE | Freq: Two times a day (BID) | ORAL | 2 refills | Status: DC

## 2022-02-04 NOTE — Patient Instructions (Signed)
Health Maintenance After Age 68 After age 68, you are at a higher risk for certain long-term diseases and infections as well as injuries from falls. Falls are a major cause of broken bones and head injuries in people who are older than age 68. Getting regular preventive care can help to keep you healthy and well. Preventive care includes getting regular testing and making lifestyle changes as recommended by your health care provider. Talk with your health care provider about: Which screenings and tests you should have. A screening is a test that checks for a disease when you have no symptoms. A diet and exercise plan that is right for you. What should I know about screenings and tests to prevent falls? Screening and testing are the best ways to find a health problem early. Early diagnosis and treatment give you the best chance of managing medical conditions that are common after age 68. Certain conditions and lifestyle choices may make you more likely to have a fall. Your health care provider may recommend: Regular vision checks. Poor vision and conditions such as cataracts can make you more likely to have a fall. If you wear glasses, make sure to get your prescription updated if your vision changes. Medicine review. Work with your health care provider to regularly review all of the medicines you are taking, including over-the-counter medicines. Ask your health care provider about any side effects that may make you more likely to have a fall. Tell your health care provider if any medicines that you take make you feel dizzy or sleepy. Strength and balance checks. Your health care provider may recommend certain tests to check your strength and balance while standing, walking, or changing positions. Foot health exam. Foot pain and numbness, as well as not wearing proper footwear, can make you more likely to have a fall. Screenings, including: Osteoporosis screening. Osteoporosis is a condition that causes  the bones to get weaker and break more easily. Blood pressure screening. Blood pressure changes and medicines to control blood pressure can make you feel dizzy. Depression screening. You may be more likely to have a fall if you have a fear of falling, feel depressed, or feel unable to do activities that you used to do. Alcohol use screening. Using too much alcohol can affect your balance and may make you more likely to have a fall. Follow these instructions at home: Lifestyle Do not drink alcohol if: Your health care provider tells you not to drink. If you drink alcohol: Limit how much you have to: 0-1 drink a day for women. 0-2 drinks a day for men. Know how much alcohol is in your drink. In the [place] one drink equals one 12 oz bottle of beer (355 mL), one 5 oz glass of wine (148 mL), or one 1 oz glass of hard liquor (44 mL). Do not use any products that contain nicotine or tobacco. These products include cigarettes, chewing tobacco, and vaping devices, such as e-cigarettes. If you need help quitting, ask your health care provider. Activity  Follow a regular exercise program to stay fit. This will help you maintain your balance. Ask your health care provider what types of exercise are appropriate for you. If you need a cane or walker, use it as recommended by your health care provider. Wear supportive shoes that have nonskid soles. Safety  Remove any tripping hazards, such as rugs, cords, and clutter. Install safety equipment such as grab bars in bathrooms and safety rails on stairs. Keep rooms and walkways   well-lit. General instructions Talk with your health care provider about your risks for falling. Tell your health care provider if: You fall. Be sure to tell your health care provider about all falls, even ones that seem minor. You feel dizzy, tiredness (fatigue), or off-balance. Take over-the-counter and prescription medicines only as told by your health care provider. These include  supplements. Eat a healthy diet and maintain a healthy weight. A healthy diet includes low-fat dairy products, low-fat (lean) meats, and fiber from whole grains, beans, and lots of fruits and vegetables. Stay current with your vaccines. Schedule regular health, dental, and eye exams. Summary Having a healthy lifestyle and getting preventive care can help to protect your health and wellness after age 68. Screening and testing are the best way to find a health problem early and help you avoid having a fall. Early diagnosis and treatment give you the best chance for managing medical conditions that are more common for people who are older than age 68. Falls are a major cause of broken bones and head injuries in people who are older than age 68. Take precautions to prevent a fall at home. Work with your health care provider to learn what changes you can make to improve your health and wellness and to prevent falls. This information is not intended to replace advice given to you by your health care provider. Make sure you discuss any questions you have with your health care provider. Document Revised: 08/28/2020 Document Reviewed: 08/28/2020 Elsevier Patient Education  2023 [employer]  

## 2022-02-04 NOTE — Progress Notes (Signed)
Subjective:    Patient ID: Jonathan Washington, male    DOB: [birth year], 66 y.o.   MRN: [id number]  Pt presents to the office today to chronic follow up. He is followed by Urologists every 6 months for hx of Prostate Cancer 2014.   He is followed by Endo every 6 months for DM. His last A1C was 6.7 that was drawn in June.  He states he does exercising class twice a week for 45 mins.   His Triglycerides were elevated to 426 & 327. He is taking fenofibrate 160 mg daily.   Gastroesophageal Reflux He complains of belching and heartburn. This is a chronic problem. The current episode started more than 1 year ago. The problem occurs rarely. The problem has been resolved. Risk factors include obesity. He has tried a PPI for the symptoms. The treatment provided moderate relief.  Hyperlipidemia This is a chronic problem. The current episode started more than 1 year ago. The problem is uncontrolled. Exacerbating diseases include obesity. Current antihyperlipidemic treatment includes statins and fibric acid derivatives. The current treatment provides mild improvement of lipids. Risk factors for coronary artery disease include dyslipidemia, diabetes mellitus, male sex, hypertension and a sedentary lifestyle.  Diabetes He presents for his follow-up diabetic visit. He has type 2 diabetes mellitus. Associated symptoms include foot paresthesias. Pertinent negatives for diabetes include no blurred vision. Diabetic complications include heart disease and peripheral neuropathy. Risk factors for coronary artery disease include diabetes mellitus, dyslipidemia, male sex, hypertension and sedentary lifestyle. He is following a generally healthy diet. His overall blood glucose range is 140-180 mg/dl.  Arthritis Presents for follow-up visit. He complains of pain and stiffness. The symptoms have been stable. Affected locations include the left MCP and right MCP. His pain is at a severity of 1/10.  Back Pain This is a  chronic problem. The current episode started more than 1 year ago. The problem occurs intermittently. The pain is at a severity of 0/10. The pain is mild.      Review of Systems  Eyes:  Negative for blurred vision.  Gastrointestinal:  Positive for heartburn.  Musculoskeletal:  Positive for arthritis, back pain and stiffness.  All other systems reviewed and are negative.      Objective:   Physical Exam Vitals reviewed.  Constitutional:      General: He is not in acute distress.    Appearance: He is well-developed.  HENT:     Head: Normocephalic.     Right Ear: Tympanic membrane normal.     Left Ear: Tympanic membrane normal.  Eyes:     General:        Right eye: No discharge.        Left eye: No discharge.     Pupils: Pupils are equal, round, and reactive to light.  Neck:     Thyroid: No thyromegaly.  Cardiovascular:     Rate and Rhythm: Normal rate and regular rhythm.     Heart sounds: Normal heart sounds. No murmur heard. Pulmonary:     Effort: Pulmonary effort is normal. No respiratory distress.     Breath sounds: Normal breath sounds. No wheezing.  Abdominal:     General: Bowel sounds are normal. There is no distension.     Palpations: Abdomen is soft.     Tenderness: There is no abdominal tenderness.  Musculoskeletal:        General: No tenderness. Normal range of motion.     Cervical back: Normal range of motion  and neck supple.  Skin:    General: Skin is warm and dry.     Findings: No erythema or rash.  Neurological:     Mental Status: He is alert and oriented to person, place, and time.     Cranial Nerves: No cranial nerve deficit.     Deep Tendon Reflexes: Reflexes are normal and symmetric.  Psychiatric:        Behavior: Behavior normal.        Thought Content: Thought content normal.        Judgment: Judgment normal.       BP 120/77   Pulse 82   Temp (!) 97 F (36.1 C) (Temporal)   Ht 5' 11" (1.803 m)   Wt 195 lb (88.5 kg)   BMI 27.20 kg/m       Assessment & Plan:  Jonathan Washington comes in today with chief complaint of Medical Management of Chronic Issues   Diagnosis and orders addressed:  1. Gastroesophageal reflux disease, unspecified whether esophagitis present - CMP14+EGFR - CBC with Differential/Platelet  2. Type 2 diabetes mellitus with other specified complication, with long-term current use of insulin (HCC) - CMP14+EGFR - CBC with Differential/Platelet - Microalbumin / creatinine urine ratio  3. Hyperlipidemia associated with type 2 diabetes mellitus (HCC) - CMP14+EGFR - CBC with Differential/Platelet  4. Arthritis of carpometacarpal (CMC) joint of thumb - CMP14+EGFR - CBC with Differential/Platelet  5. Malignant neoplasm of prostate (HCC) - CMP14+EGFR - CBC with Differential/Platelet  6. H/O prostate cancer - CMP14+EGFR - CBC with Differential/Platelet  7. Chronic left-sided low back pain with left-sided sciatica - CMP14+EGFR - CBC with Differential/Platelet  8. Hypertriglyceridemia Start Vascepa BID Low fat diet  - icosapent Ethyl (VASCEPA) 1 g capsule; Take 2 capsules (2 g total) by mouth 2 (two) times daily.  Dispense: 120 capsule; Refill: 2   Labs pending Health Maintenance reviewed Diet and exercise encouraged  Follow up plan: 6 months    Evelina Dun, FNP

## 2022-02-05 LAB — CMP14+EGFR
ALT: 31 IU/L (ref 0–44)
AST: 32 IU/L (ref 0–40)
Albumin/Globulin Ratio: 1.9 (ref 1.2–2.2)
Albumin: 4.5 g/dL (ref 3.9–4.9)
Alkaline Phosphatase: 83 IU/L (ref 44–121)
BUN/Creatinine Ratio: 24 (ref 10–24)
BUN: 24 mg/dL (ref 8–27)
Bilirubin Total: 0.4 mg/dL (ref 0.0–1.2)
CO2: 22 mmol/L (ref 20–29)
Calcium: 9.5 mg/dL (ref 8.6–10.2)
Chloride: 101 mmol/L (ref 96–106)
Creatinine, Ser: 0.99 mg/dL (ref 0.76–1.27)
Globulin, Total: 2.4 g/dL (ref 1.5–4.5)
Glucose: 117 mg/dL — ABNORMAL HIGH (ref 70–99)
Potassium: 4 mmol/L (ref 3.5–5.2)
Sodium: 137 mmol/L (ref 134–144)
Total Protein: 6.9 g/dL (ref 6.0–8.5)
eGFR: 83 mL/min/{1.73_m2} (ref >59)

## 2022-02-05 LAB — MICROALBUMIN / CREATININE URINE RATIO
Creatinine, Urine: 37 mg/dL
Microalb/Creat Ratio: <8 mg/g creat (ref 0–29)
Microalbumin, Urine: <3 ug/mL

## 2022-02-05 LAB — CBC WITH DIFFERENTIAL/PLATELET
Basophils Absolute: 0.1 10*3/uL (ref 0.0–0.2)
Basos: 2 %
EOS (ABSOLUTE): 0.5 10*3/uL — ABNORMAL HIGH (ref 0.0–0.4)
Eos: 10 %
Hematocrit: 47.4 % (ref 37.5–51.0)
Hemoglobin: 16.3 g/dL (ref 13.0–17.7)
Immature Grans (Abs): 0 10*3/uL (ref 0.0–0.1)
Immature Granulocytes: 1 %
Lymphocytes Absolute: 1 10*3/uL (ref 0.7–3.1)
Lymphs: 21 %
MCH: 32 pg (ref 26.6–33.0)
MCHC: 34.4 g/dL (ref 31.5–35.7)
MCV: 93 fL (ref 79–97)
Monocytes Absolute: 0.5 10*3/uL (ref 0.1–0.9)
Monocytes: 10 %
Neutrophils Absolute: 2.7 10*3/uL (ref 1.4–7.0)
Neutrophils: 56 %
Platelets: 247 10*3/uL (ref 150–450)
RBC: 5.09 x10E6/uL (ref 4.14–5.80)
RDW: 13 % (ref 11.6–15.4)
WBC: 4.9 10*3/uL (ref 3.4–10.8)

## 2022-02-17 ENCOUNTER — Other Ambulatory Visit: Payer: Self-pay | Admitting: Family

## 2022-02-18 ENCOUNTER — Ambulatory Visit (INDEPENDENT_AMBULATORY_CARE_PROVIDER_SITE_OTHER): Payer: Medicare Other

## 2022-02-18 DIAGNOSIS — Z23 Encounter for immunization: Secondary | ICD-10-CM

## 2022-02-24 ENCOUNTER — Other Ambulatory Visit: Payer: Self-pay | Admitting: Family

## 2022-03-14 ENCOUNTER — Other Ambulatory Visit: Payer: Self-pay | Admitting: Family

## 2022-03-24 ENCOUNTER — Other Ambulatory Visit: Payer: Self-pay | Admitting: Family

## 2022-04-27 ENCOUNTER — Other Ambulatory Visit: Payer: Self-pay | Admitting: Family

## 2022-05-16 ENCOUNTER — Ambulatory Visit (INDEPENDENT_AMBULATORY_CARE_PROVIDER_SITE_OTHER): Payer: Medicare Other

## 2022-05-16 VITALS — Ht 71.0 in | Wt 187.0 lb

## 2022-05-16 DIAGNOSIS — Z0000 Encounter for general adult medical examination without abnormal findings: Secondary | ICD-10-CM | POA: Diagnosis not present

## 2022-05-16 NOTE — Progress Notes (Signed)
Subjective:   Jonathan Washington is a 67 y.o. male who presents for Medicare Annual/Subsequent preventive examination. I connected with  Jonathan Washington on 05/16/22 by a audio enabled telemedicine application and verified that I am speaking with the correct person using two identifiers.  Patient Location: Home  Provider Location: Home Office  I discussed the limitations of evaluation and management by telemedicine. The patient expressed understanding and agreed to proceed.  Review of Systems     Cardiac Risk Factors include: advanced age (>98mn, >>47women);diabetes mellitus;hypertension;male gender;dyslipidemia     Objective:    Today's Vitals   05/16/22 0838  Weight: 187 lb (84.8 kg)  Height: '5\' 11"'$  (1.803 m)   Body mass index is 26.08 kg/m.     05/16/2022    8:43 AM 12/18/2021   12:26 PM 07/03/2021    9:40 PM 03/29/2021   10:23 AM 12/17/2018    8:35 AM 12/17/2018    8:33 AM 09/01/2018    1:05 PM  Advanced Directives  Does Patient Have a Medical Advance Directive? Yes Yes No No Yes No No  [place]    Living will    Copy of [place] Chart? No - copy requested        Would patient like information on creating a medical advance directive?   No - Patient declined No - Patient declined   No - Patient declined    Current Medications (verified) Outpatient Encounter Medications as of 05/16/2022  Medication Sig   amLODipine (NORVASC) 2.5 MG tablet Take 1 tablet by mouth once daily   aspirin 81 MG tablet Take 81 mg by mouth.   Continuous Blood Gluc Sensor (FREESTYLE LIBRE 2 SENSOR) MISC USE AS DIRECTED EVERY  14  DAYS   fenofibrate 160 MG tablet Take 1 tablet by mouth once daily   icosapent Ethyl (VASCEPA) 1 g capsule Take 2 capsules (2 g total) by mouth 2 (two) times daily.   imipramine (TOFRANIL) 10 MG tablet TAKE 1 TABLET BY MOUTH AT BEDTIME   JARDIANCE 10 MG TABS tablet Take 10 mg by mouth  daily.   losartan (COZAAR) 100 MG tablet Take 1 tablet by mouth once daily   metFORMIN (GLUCOPHAGE-XR) 500 MG 24 hr tablet Take 500 mg by mouth 3 (three) times daily.   omeprazole (PRILOSEC) 20 MG capsule Take 20 mg by mouth daily as needed. For heartburn   rosuvastatin (CRESTOR) 5 MG tablet Take 1 tablet by mouth once daily   sildenafil (VIAGRA) 100 MG tablet Take 100 mg by mouth daily as needed for erectile dysfunction.   tamsulosin (FLOMAX) 0.4 MG CAPS capsule Take 0.4 mg by mouth daily.   timolol (TIMOPTIC) 0.5 % ophthalmic solution Place 1 drop into the right eye 2 (two) times daily.   No facility-administered encounter medications on file as of 05/16/2022.    Allergies (verified) Niacin   History: Past Medical History:  Diagnosis Date   Cataract    Dyslipidemia    Fatty liver    Fibromyalgia    GERD (gastroesophageal reflux disease)    Glaucoma    RIGHT EYE   Glucose intolerance (impaired glucose tolerance)    Hypertension    Pneumonia    4 YRS AGO   [place]    Vertigo    ESPECIALLY IF PT TURNS HIS HEAD OR MOVES A CERTAIN WAY   Past Surgical History:  Procedure Laterality Date  CHOLECYSTECTOMY     EYE SURGERY Bilateral    cataracts   LEFT KNEE ARTHROSCOPY     LYMPHADENECTOMY  05/27/2012   Procedure: LYMPHADENECTOMY;  Surgeon: Bernestine Amass, MD;  Location: [facility];  Service: Urology;  Laterality: Bilateral;   ROBOT ASSISTED LAPAROSCOPIC RADICAL PROSTATECTOMY  05/27/2012   Procedure: ROBOTIC ASSISTED LAPAROSCOPIC RADICAL PROSTATECTOMY;  Surgeon: Bernestine Amass, MD;  Location: [facility];  Service: Urology;  Laterality: N/A;   SURGERY FOR DEVIATED NASAL SEPTUM     Family History  Problem Relation Age of Onset   Breast cancer Mother    Bladder Cancer Father    Throat cancer Father    Lung cancer Paternal Aunt        non smoker   Social History   Socioeconomic History   Marital status: Married    Spouse name: Not on file   Number of children: 2    Years of education: Not on file   Highest education level: Not on file  Occupational History    Comment: retired since 2015  Tobacco Use   Smoking status: Former    Packs/day: 2.00    Years: 15.00    Total pack years: 30.00    Types: Cigarettes    Quit date: 04/22/1978    Years since quitting: 44.0   Smokeless tobacco: Never   Tobacco comments:    [place]  Vaping Use   Vaping Use: Never used  Substance and Sexual Activity   Alcohol use: Yes    Alcohol/week: 2.0 standard drinks of alcohol    Types: 2 Cans of beer per week   Drug use: No   Sexual activity: Yes    Comment: with aid of Viagra  Other Topics Concern   Not on file  Social History Narrative   Not on file   Social Determinants of Health   Financial Resource Strain: Low Risk  (05/16/2022)   Overall Financial Resource Strain (CARDIA)    Difficulty of Paying Living Expenses: Not hard at all  Food Insecurity: No Food Insecurity (05/16/2022)   Hunger Vital Sign    Worried About Running Out of Food in the Last Year: Never true    Ran Out of Food in the Last Year: Never true  Transportation Needs: No Transportation Needs (05/16/2022)   PRAPARE - Hydrologist (Medical): No    Lack of Transportation (Non-Medical): No  Physical Activity: Insufficiently Active (05/16/2022)   Exercise Vital Sign    Days of Exercise per Week: 2 days    Minutes of Exercise per Session: 60 min  Stress: No Stress Concern Present (05/16/2022)   Axis    Feeling of Stress : Not at all  Social Connections: [place] (05/16/2022)   Social Connection and Isolation Panel [NHANES]    Frequency of Communication with Friends and Family: More than three times a week    Frequency of Social Gatherings with Friends and Family: More than three times a week    Attends Religious Services: More than 4 times per year    Active Member of [employer] or  Organizations: Yes    Attends Music therapist: More than 4 times per year    Marital Status: Married    Tobacco Counseling Counseling given: Not Answered Tobacco comments: [place]   Clinical Intake:  Pre-visit preparation completed: Yes  Pain : No/denies pain     Nutritional Risks:  None Diabetes: Yes  How often do you need to have someone help you when you read instructions, pamphlets, or other written materials from your doctor or pharmacy?: 1 - Never  Diabetic?yes  Nutrition Risk Assessment:  Has the patient had any N/V/D within the last 2 months?  No  Does the patient have any non-healing wounds?  No  Has the patient had any unintentional weight loss or weight gain?  No   Diabetes:  Is the patient diabetic?  Yes  If diabetic, was a CBG obtained today?  No  Did the patient bring in their glucometer from home?  No  How often do you monitor your CBG's? Daily .   Financial Strains and Diabetes Management:  Are you having any financial strains with the device, your supplies or your medication? No .  Does the patient want to be seen by Chronic Care Management for management of their diabetes?  No  Would the patient like to be referred to a Nutritionist or for Diabetic Management?  No   Diabetic Exams:  Diabetic Eye Exam: Completed 067/2023 Diabetic Foot Exam: Overdue, Pt has been advised about the importance in completing this exam. Pt is scheduled for diabetic foot exam on next office visit .   Interpreter Needed?: No  Information entered by :: Jadene Pierini, LPN   Activities of Daily Living    05/16/2022    8:44 AM  In your present state of health, do you have any difficulty performing the following activities:  Hearing? 0  Vision? 0  Difficulty concentrating or making decisions? 0  Walking or climbing stairs? 0  Dressing or bathing? 0  Doing errands, shopping? 0  Preparing Food and eating ? N  Using the Toilet? N  In the past  six months, have you accidently leaked urine? N  Do you have problems with loss of bowel control? N  Managing your Medications? N  Managing your Finances? N  Housekeeping or managing your Housekeeping? N    Patient Care Team: Sharion Balloon, FNP as PCP - General (Family Medicine) Cira Rue, RN Nurse Navigator as Registered Nurse ([facility]  Indicate any recent Medical Services you may have received from other than [facility] providers in the past year (date may be approximate).     Assessment:   This is a routine wellness examination for [facility].  Hearing/Vision screen Vision Screening - Comments:: Wears rx glasses - up to date with routine eye exams with  Dr.Groat   Dietary issues and exercise activities discussed: Current Exercise Habits: Home exercise routine, Type of exercise: walking, Time (Minutes): 60, Frequency (Times/Week): 2, Weekly Exercise (Minutes/Week): 120, Intensity: Mild, Exercise limited by: None identified   Goals Addressed             This Visit's Progress    Patient Stated   On track    03/29/2021 AWV Goal: Fall Prevention  Over the next year, patient will decrease their risk for falls by: Using assistive devices, such as a cane or walker, as needed Identifying fall risks within their home and correcting them by: Removing throw rugs Adding handrails to stairs or ramps Removing clutter and keeping a clear pathway throughout the home Increasing light, especially at night Adding shower handles/bars Raising toilet seat Identifying potential personal risk factors for falls: Medication side effects Incontinence/urgency Vestibular dysfunction Hearing loss Musculoskeletal disorders Neurological disorders Orthostatic hypotension         Depression Screen    05/16/2022  8:42 AM 03/29/2021   10:32 AM 12/18/2020   10:11 AM 07/27/2020    2:16 PM 05/15/2017    4:03 PM  PHQ 2/9 Scores  PHQ - 2 Score 0 0 0 0 0   PHQ- 9 Score   1      Fall Risk    05/16/2022    8:40 AM 03/29/2021   10:31 AM 12/18/2020   10:11 AM 07/27/2020    2:15 PM 05/15/2017    4:03 PM  Fall Risk   Falls in the past year? 0 '1 1 1 '$ No  Number falls in past yr: 0 1 0 1   Injury with Fall? 0 1 1 0   Risk for fall due to : No Fall Risks History of fall(s) No Fall Risks    Follow up Falls prevention discussed Falls evaluation completed Education provided      FALL RISK PREVENTION PERTAINING TO THE HOME:  Any stairs in or around the home? No  If so, are there any without handrails? No  Home free of loose throw rugs in walkways, pet beds, electrical cords, etc? Yes  Adequate lighting in your home to reduce risk of falls? Yes   ASSISTIVE DEVICES UTILIZED TO PREVENT FALLS:  Life alert? No  Use of a cane, walker or w/c? No  Grab bars in the bathroom? No  Shower chair or bench in shower? No  Elevated toilet seat or a handicapped toilet? No          05/16/2022    8:45 AM 03/29/2021   10:28 AM  6CIT Screen  What Year? 0 points 0 points  What month? 0 points   What time? 0 points 0 points  Count back from 20 0 points 0 points  Months in reverse 0 points 0 points  Repeat phrase 0 points 0 points  Total Score 0 points     Immunizations Immunization History  Administered Date(s) Administered   Influenza Split 04/09/2010, 03/21/2014   Influenza, High Dose Seasonal PF 02/17/2019, 03/09/2020   Influenza,inj,Quad PF,6-35 Mos 01/22/2018   Influenza,inj,quad, With Preservative 01/07/2019   PFIZER(Purple Top)SARS-COV-2 Vaccination 05/18/2019, 06/08/2019, 01/15/2020   PNEUMOCOCCAL CONJUGATE-20 07/26/2021   Pneumococcal Polysaccharide-23 06/21/2008, 11/24/2014, 02/17/2019   Td 07/22/2001, 01/22/2018   Tdap 10/22/2010   Zoster Recombinat (Shingrix) 07/26/2021, 02/18/2022   Zoster, Live 11/05/2013    TDAP status: Up to date  Flu Vaccine status: Up to date  Pneumococcal vaccine status: Up to date  Covid-19 vaccine  status: Completed vaccines  Qualifies for Shingles Vaccine? Yes   Zostavax completed Yes   Shingrix Completed?: Yes  Screening Tests Health Maintenance  Topic Date Due   COVID-19 Vaccine (4 - 2023-24 season) 12/21/2021   OPHTHALMOLOGY EXAM  05/15/2022   INFLUENZA VACCINE  07/21/2022 (Originally 11/20/2021)   Diabetic kidney evaluation - eGFR measurement  02/05/2023   Diabetic kidney evaluation - Urine ACR  02/05/2023   FOOT EXAM  02/05/2023   Medicare Annual Wellness (AWV)  05/17/2023   COLONOSCOPY (Pts 45-49yr Insurance coverage will need to be confirmed)  07/04/2024   DTaP/Tdap/Td (4 - Td or Tdap) 01/23/2028   Pneumonia Vaccine 67 Years old  Completed   Hepatitis C Screening  Completed   Zoster Vaccines- Shingrix  Completed   HPV VACCINES  Aged Out   HEMOGLOBIN A1C  Discontinued    Health Maintenance  Health Maintenance Due  Topic Date Due   COVID-19 Vaccine (4 - 2023-24 season) 12/21/2021   OPHTHALMOLOGY EXAM  05/15/2022  Colorectal cancer screening: Type of screening: Colonoscopy. Completed 07/05/2014. Repeat every 10 years  Lung Cancer Screening: (Low Dose CT Chest recommended if Age 77-80 years, 30 pack-year currently smoking OR have quit w/in 15years.) does not qualify.   Lung Cancer Screening Referral: n/a  Additional Screening:  Hepatitis C Screening: does not qualify;   Vision Screening: Recommended annual ophthalmology exams for early detection of glaucoma and other disorders of the eye. Is the patient up to date with their annual eye exam?  Yes  Who is the provider or what is the name of the office in which the patient attends annual eye exams? Dr.groat  If pt is not established with a provider, would they like to be referred to a provider to establish care? No .   Dental Screening: Recommended annual dental exams for proper oral hygiene  Community Resource Referral / Chronic Care Management: CRR required this visit?  No   CCM required this visit?   No      Plan:     I have personally reviewed and noted the following in the patient's chart:   Medical and social history Use of alcohol, tobacco or illicit drugs  Current medications and supplements including opioid prescriptions. Patient is not currently taking opioid prescriptions. Functional ability and status Nutritional status Physical activity Advanced directives List of other physicians Hospitalizations, surgeries, and ER visits in previous 12 months Vitals Screenings to include cognitive, depression, and falls Referrals and appointments  In addition, I have reviewed and discussed with patient certain preventive protocols, quality metrics, and best practice recommendations. A written personalized care plan for preventive services as well as general preventive health recommendations were provided to patient.     Daphane Shepherd, LPN   7/37/1062   Nurse Notes: none

## 2022-05-16 NOTE — Patient Instructions (Signed)
Jonathan Washington , Thank you for taking time to come for your Medicare Wellness Visit. I appreciate your ongoing commitment to your health goals. Please review the following plan we discussed and let me know if I can assist you in the future.   These are the goals we discussed:  Goals      Patient Stated     03/29/2021 AWV Goal: Fall Prevention  Over the next year, patient will decrease their risk for falls by: Using assistive devices, such as a cane or walker, as needed Identifying fall risks within their home and correcting them by: Removing throw rugs Adding handrails to stairs or ramps Removing clutter and keeping a clear pathway throughout the home Increasing light, especially at night Adding shower handles/bars Raising toilet seat Identifying potential personal risk factors for falls: Medication side effects Incontinence/urgency Vestibular dysfunction Hearing loss Musculoskeletal disorders Neurological disorders Orthostatic hypotension          This is a list of the screening recommended for you and due dates:  Health Maintenance  Topic Date Due   COVID-19 Vaccine (4 - 2023-24 season) 12/21/2021   Eye exam for diabetics  05/15/2022   Flu Shot  07/21/2022*   Yearly kidney function blood test for diabetes  02/05/2023   Yearly kidney health urinalysis for diabetes  02/05/2023   Complete foot exam   02/05/2023   Medicare Annual Wellness Visit  05/17/2023   Colon Cancer Screening  07/04/2024   DTaP/Tdap/Td vaccine (4 - Td or Tdap) 01/23/2028   Pneumonia Vaccine  Completed   Hepatitis C Screening: USPSTF Recommendation to screen - Ages 82-79 yo.  Completed   Zoster (Shingles) Vaccine  Completed   HPV Vaccine  Aged Out   Hemoglobin A1C  Discontinued  *Topic was postponed. The date shown is not the original due date.    Advanced directives: Please bring a copy of your health care power of attorney and living will to the office to be added to your chart at your  convenience.   Conditions/risks identified: Aim for 30 minutes of exercise or brisk walking, 6-8 glasses of water, and 5 servings of fruits and vegetables each day.   Next appointment: Follow up in one year for your annual wellness visit.   Preventive Care 95 Years and Older, Male  Preventive care refers to lifestyle choices and visits with your health care provider that can promote health and wellness. What does preventive care include? A yearly physical exam. This is also called an annual well check. Dental exams once or twice a year. Routine eye exams. Ask your health care provider how often you should have your eyes checked. Personal lifestyle choices, including: Daily care of your teeth and gums. Regular physical activity. Eating a healthy diet. Avoiding tobacco and drug use. Limiting alcohol use. Practicing safe sex. Taking low doses of aspirin every day. Taking vitamin and mineral supplements as recommended by your health care provider. What happens during an annual well check? The services and screenings done by your health care provider during your annual well check will depend on your age, overall health, lifestyle risk factors, and family history of disease. Counseling  Your health care provider may ask you questions about your: Alcohol use. Tobacco use. Drug use. Emotional well-being. Home and relationship well-being. Sexual activity. Eating habits. History of falls. Memory and ability to understand (cognition). Work and work Statistician. Screening  You may have the following tests or measurements: Height, weight, and BMI. Blood pressure. Lipid and cholesterol  levels. These may be checked every 5 years, or more frequently if you are over 41 years old. Skin check. Lung cancer screening. You may have this screening every year starting at age 38 if you have a 30-pack-year history of smoking and currently smoke or have quit within the past 15 years. Fecal occult  blood test (FOBT) of the stool. You may have this test every year starting at age 53. Flexible sigmoidoscopy or colonoscopy. You may have a sigmoidoscopy every 5 years or a colonoscopy every 10 years starting at age 56. Prostate cancer screening. Recommendations will vary depending on your family history and other risks. Hepatitis C blood test. Hepatitis B blood test. Sexually transmitted disease (STD) testing. Diabetes screening. This is done by checking your blood sugar (glucose) after you have not eaten for a while (fasting). You may have this done every 1-3 years. Abdominal aortic aneurysm (AAA) screening. You may need this if you are a current or former smoker. Osteoporosis. You may be screened starting at age 46 if you are at high risk. Talk with your health care provider about your test results, treatment options, and if necessary, the need for more tests. Vaccines  Your health care provider may recommend certain vaccines, such as: Influenza vaccine. This is recommended every year. Tetanus, diphtheria, and acellular pertussis (Tdap, Td) vaccine. You may need a Td booster every 10 years. Zoster vaccine. You may need this after age 23. Pneumococcal 13-valent conjugate (PCV13) vaccine. One dose is recommended after age 27. Pneumococcal polysaccharide (PPSV23) vaccine. One dose is recommended after age 76. Talk to your health care provider about which screenings and vaccines you need and how often you need them. This information is not intended to replace advice given to you by your health care provider. Make sure you discuss any questions you have with your health care provider. Document Released: 05/05/2015 Document Revised: 12/27/2015 Document Reviewed: 02/07/2015 Elsevier Interactive Patient Education  2017 [place] Prevention in the Home Falls can cause injuries. They can happen to people of all ages. There are many things you can do to make your home safe and to help  prevent falls. What can I do on the outside of my home? Regularly fix the edges of walkways and driveways and fix any cracks. Remove anything that might make you trip as you walk through a door, such as a raised step or threshold. Trim any bushes or trees on the path to your home. Use bright outdoor lighting. Clear any walking paths of anything that might make someone trip, such as rocks or tools. Regularly check to see if handrails are loose or broken. Make sure that both sides of any steps have handrails. Any raised decks and porches should have guardrails on the edges. Have any leaves, snow, or ice cleared regularly. Use sand or salt on walking paths during winter. Clean up any spills in your garage right away. This includes oil or grease spills. What can I do in the bathroom? Use night lights. Install grab bars by the toilet and in the tub and shower. Do not use towel bars as grab bars. Use non-skid mats or decals in the tub or shower. If you need to sit down in the shower, use a plastic, non-slip stool. Keep the floor dry. Clean up any water that spills on the floor as soon as it happens. Remove soap buildup in the tub or shower regularly. Attach bath mats securely with double-sided non-slip rug tape. Do not have  throw rugs and other things on the floor that can make you trip. What can I do in the bedroom? Use night lights. Make sure that you have a light by your bed that is easy to reach. Do not use any sheets or blankets that are too big for your bed. They should not hang down onto the floor. Have a firm chair that has side arms. You can use this for support while you get dressed. Do not have throw rugs and other things on the floor that can make you trip. What can I do in the kitchen? Clean up any spills right away. Avoid walking on wet floors. Keep items that you use a lot in easy-to-reach places. If you need to reach something above you, use a strong step stool that has a  grab bar. Keep electrical cords out of the way. Do not use floor polish or wax that makes floors slippery. If you must use wax, use non-skid floor wax. Do not have throw rugs and other things on the floor that can make you trip. What can I do with my stairs? Do not leave any items on the stairs. Make sure that there are handrails on both sides of the stairs and use them. Fix handrails that are broken or loose. Make sure that handrails are as long as the stairways. Check any carpeting to make sure that it is firmly attached to the stairs. Fix any carpet that is loose or worn. Avoid having throw rugs at the top or bottom of the stairs. If you do have throw rugs, attach them to the floor with carpet tape. Make sure that you have a light switch at the top of the stairs and the bottom of the stairs. If you do not have them, ask someone to add them for you. What else can I do to help prevent falls? Wear shoes that: Do not have high heels. Have rubber bottoms. Are comfortable and fit you well. Are closed at the toe. Do not wear sandals. If you use a stepladder: Make sure that it is fully opened. Do not climb a closed stepladder. Make sure that both sides of the stepladder are locked into place. Ask someone to hold it for you, if possible. Clearly mark and make sure that you can see: Any grab bars or handrails. First and last steps. Where the edge of each step is. Use tools that help you move around (mobility aids) if they are needed. These include: Canes. Walkers. Scooters. Crutches. Turn on the lights when you go into a dark area. Replace any light bulbs as soon as they burn out. Set up your furniture so you have a clear path. Avoid moving your furniture around. If any of your floors are uneven, fix them. If there are any pets around you, be aware of where they are. Review your medicines with your doctor. Some medicines can make you feel dizzy. This can increase your chance of  falling. Ask your doctor what other things that you can do to help prevent falls. This information is not intended to replace advice given to you by your health care provider. Make sure you discuss any questions you have with your health care provider. Document Released: 02/02/2009 Document Revised: 09/14/2015 Document Reviewed: 05/13/2014 Elsevier Interactive Patient Education  2017 [employer].

## 2022-05-17 ENCOUNTER — Telehealth: Payer: Self-pay | Admitting: Family

## 2022-05-29 LAB — HM DIABETES EYE EXAM

## 2022-06-20 ENCOUNTER — Ambulatory Visit (INDEPENDENT_AMBULATORY_CARE_PROVIDER_SITE_OTHER): Payer: Medicare Other | Admitting: Pharmacist

## 2022-06-20 DIAGNOSIS — E1169 Type 2 diabetes mellitus with other specified complication: Secondary | ICD-10-CM

## 2022-06-20 DIAGNOSIS — Z794 Long term (current) use of insulin: Secondary | ICD-10-CM

## 2022-06-28 NOTE — Progress Notes (Signed)
    06/20/2022 Name: Jonathan Washington MRN: [id number] DOB: Mar 29, 1956    Discussed libre CGM approval through medicare.  Patient is not on insulin and is not experiencing hypoglycemia, therefore does not qualify for CGM via Medicare guidelines.  Patient was given phone number to call for $75 voucher (may work, but not 100%).  Libre 2 samples given for patient to try.  Patient follows with endocrine for his diabetes and is currently controlled.   Regina Eck, PharmD, [place] [facility] Pharmacist, [facility]

## 2022-07-23 ENCOUNTER — Other Ambulatory Visit: Payer: Self-pay | Admitting: Family

## 2022-08-06 ENCOUNTER — Ambulatory Visit (INDEPENDENT_AMBULATORY_CARE_PROVIDER_SITE_OTHER): Payer: Medicare Other | Admitting: Family

## 2022-08-06 ENCOUNTER — Encounter: Payer: Self-pay | Admitting: Family

## 2022-08-06 VITALS — BP 126/79 | HR 91 | Temp 98.1°F | Ht 71.0 in | Wt 195.4 lb

## 2022-08-06 DIAGNOSIS — K219 Gastro-esophageal reflux disease without esophagitis: Secondary | ICD-10-CM

## 2022-08-06 DIAGNOSIS — E1169 Type 2 diabetes mellitus with other specified complication: Secondary | ICD-10-CM

## 2022-08-06 DIAGNOSIS — G8929 Other chronic pain: Secondary | ICD-10-CM

## 2022-08-06 DIAGNOSIS — C61 Malignant neoplasm of prostate: Secondary | ICD-10-CM

## 2022-08-06 DIAGNOSIS — H903 Sensorineural hearing loss, bilateral: Secondary | ICD-10-CM

## 2022-08-06 DIAGNOSIS — Z8546 Personal history of malignant neoplasm of prostate: Secondary | ICD-10-CM

## 2022-08-06 DIAGNOSIS — M5442 Lumbago with sciatica, left side: Secondary | ICD-10-CM | POA: Diagnosis not present

## 2022-08-06 DIAGNOSIS — M1810 Unilateral primary osteoarthritis of first carpometacarpal joint, unspecified hand: Secondary | ICD-10-CM

## 2022-08-06 DIAGNOSIS — E785 Hyperlipidemia, unspecified: Secondary | ICD-10-CM

## 2022-08-06 DIAGNOSIS — Z794 Long term (current) use of insulin: Secondary | ICD-10-CM

## 2022-08-06 NOTE — Progress Notes (Signed)
Subjective:    Patient ID: Jonathan Washington, male    DOB: October 06, 1955, 67 y.o.   MRN: [id number]  Chief Complaint  Patient presents with   Medical Management of Chronic Issues   Pt presents to the office today to chronic follow up. He is followed by Urologists every 3 months for hx of Prostate Cancer 2014.  His PSA was elevated and he is getting a PET scan.    He is followed by Endo every 6 months for DM. His last A1C was 6.9 . He states he does exercising class twice a week for 45 mins.    His Triglycerides were elevated to 327. He is taking fenofibrate 160 mg daily.  Gastroesophageal Reflux He complains of belching and heartburn. This is a chronic problem. The current episode started more than 1 year ago. The problem occurs occasionally. He has tried a PPI for the symptoms. The treatment provided moderate relief.  Diabetes He presents for his follow-up diabetic visit. He has type 2 diabetes mellitus. Pertinent negatives for diabetes include no blurred vision and no foot paresthesias. Symptoms are stable. Risk factors for coronary artery disease include dyslipidemia, diabetes mellitus, hypertension and sedentary lifestyle. He is following a generally healthy diet. His overall blood glucose range is 110-130 mg/dl. Eye exam is current.  Hyperlipidemia This is a chronic problem. The current episode started more than 1 year ago. The problem is uncontrolled. Exacerbating diseases include obesity. Current antihyperlipidemic treatment includes fibric acid derivatives, herbal therapy and statins. The current treatment provides moderate improvement of lipids. Risk factors for coronary artery disease include dyslipidemia and hypertension.  Arthritis Presents for follow-up visit. Affected locations include the left MCP and right MCP. His pain is at a severity of 5/10.      Review of Systems  Eyes:  Negative for blurred vision.  Gastrointestinal:  Positive for heartburn.  Musculoskeletal:   Positive for arthritis.  All other systems reviewed and are negative.      Objective:   Physical Exam Vitals reviewed.  Constitutional:      General: He is not in acute distress.    Appearance: He is well-developed.  HENT:     Head: Normocephalic.     Right Ear: Tympanic membrane normal.     Left Ear: Tympanic membrane normal.  Eyes:     General:        Right eye: No discharge.        Left eye: No discharge.     Pupils: Pupils are equal, round, and reactive to light.  Neck:     Thyroid: No thyromegaly.  Cardiovascular:     Rate and Rhythm: Normal rate and regular rhythm.     Heart sounds: Normal heart sounds. No murmur heard. Pulmonary:     Effort: Pulmonary effort is normal. No respiratory distress.     Breath sounds: Normal breath sounds. No wheezing.  Abdominal:     General: Bowel sounds are normal. There is no distension.     Palpations: Abdomen is soft.     Tenderness: There is no abdominal tenderness.  Musculoskeletal:        General: No tenderness. Normal range of motion.     Cervical back: Normal range of motion and neck supple.  Skin:    General: Skin is warm and dry.     Findings: No erythema or rash.  Neurological:     Mental Status: He is alert and oriented to person, place, and time.  Cranial Nerves: No cranial nerve deficit.     Deep Tendon Reflexes: Reflexes are normal and symmetric.  Psychiatric:        Behavior: Behavior normal.        Thought Content: Thought content normal.        Judgment: Judgment normal.       BP 126/79   Pulse 91   Temp 98.1 F (36.7 C) (Temporal)   Ht 5\' 11"  (1.803 m)   Wt 195 lb 6.4 oz (88.6 kg)   SpO2 98%   BMI 27.25 kg/m      Assessment & Plan:   Jonathan Washington comes in today with chief complaint of Medical Management of Chronic Issues   Diagnosis and orders addressed:  1. Type 2 diabetes mellitus with other specified complication, with long-term current use of insulin - CMP14+EGFR  2.  Gastroesophageal reflux disease, unspecified whether esophagitis present - CMP14+EGFR  3. Arthritis of carpometacarpal (CMC) joint of thumb - CMP14+EGFR  4. H/O prostate cancer - CMP14+EGFR  5. Chronic left-sided low back pain with left-sided sciatica - CMP14+EGFR  6. Hyperlipidemia associated with type 2 diabetes mellitus - CMP14+EGFR  7. Malignant neoplasm of prostate - CMP14+EGFR  8. Sensorineural hearing loss (SNHL), bilateral - CMP14+EGFR   Labs pending Health Maintenance reviewed Diet and exercise encouraged  Follow up plan: 6 months    Jannifer Rodney, FNP

## 2022-08-06 NOTE — Patient Instructions (Signed)
Health Maintenance After Age 69 After age 69, you are at a higher risk for certain long-term diseases and infections as well as injuries from falls. Falls are a major cause of broken bones and head injuries in people who are older than age 69. Getting regular preventive care can help to keep you healthy and well. Preventive care includes getting regular testing and making lifestyle changes as recommended by your health care provider. Talk with your health care provider about: Which screenings and tests you should have. A screening is a test that checks for a disease when you have no symptoms. A diet and exercise plan that is right for you. What should I know about screenings and tests to prevent falls? Screening and testing are the best ways to find a health problem early. Early diagnosis and treatment give you the best chance of managing medical conditions that are common after age 69. Certain conditions and lifestyle choices may make you more likely to have a fall. Your health care provider may recommend: Regular vision checks. Poor vision and conditions such as cataracts can make you more likely to have a fall. If you wear glasses, make sure to get your prescription updated if your vision changes. Medicine review. Work with your health care provider to regularly review all of the medicines you are taking, including over-the-counter medicines. Ask your health care provider about any side effects that may make you more likely to have a fall. Tell your health care provider if any medicines that you take make you feel dizzy or sleepy. Strength and balance checks. Your health care provider may recommend certain tests to check your strength and balance while standing, walking, or changing positions. Foot health exam. Foot pain and numbness, as well as not wearing proper footwear, can make you more likely to have a fall. Screenings, including: Osteoporosis screening. Osteoporosis is a condition that causes  the bones to get weaker and break more easily. Blood pressure screening. Blood pressure changes and medicines to control blood pressure can make you feel dizzy. Depression screening. You may be more likely to have a fall if you have a fear of falling, feel depressed, or feel unable to do activities that you used to do. Alcohol use screening. Using too much alcohol can affect your balance and may make you more likely to have a fall. Follow these instructions at home: Lifestyle Do not drink alcohol if: Your health care provider tells you not to drink. If you drink alcohol: Limit how much you have to: 0-1 drink a day for women. 0-2 drinks a day for men. Know how much alcohol is in your drink. In the [place] one drink equals one 12 oz bottle of beer (355 mL), one 5 oz glass of wine (148 mL), or one 1 oz glass of hard liquor (44 mL). Do not use any products that contain nicotine or tobacco. These products include cigarettes, chewing tobacco, and vaping devices, such as e-cigarettes. If you need help quitting, ask your health care provider. Activity  Follow a regular exercise program to stay fit. This will help you maintain your balance. Ask your health care provider what types of exercise are appropriate for you. If you need a cane or walker, use it as recommended by your health care provider. Wear supportive shoes that have nonskid soles. Safety  Remove any tripping hazards, such as rugs, cords, and clutter. Install safety equipment such as grab bars in bathrooms and safety rails on stairs. Keep rooms and walkways   well-lit. General instructions Talk with your health care provider about your risks for falling. Tell your health care provider if: You fall. Be sure to tell your health care provider about all falls, even ones that seem minor. You feel dizzy, tiredness (fatigue), or off-balance. Take over-the-counter and prescription medicines only as told by your health care provider. These include  supplements. Eat a healthy diet and maintain a healthy weight. A healthy diet includes low-fat dairy products, low-fat (lean) meats, and fiber from whole grains, beans, and lots of fruits and vegetables. Stay current with your vaccines. Schedule regular health, dental, and eye exams. Summary Having a healthy lifestyle and getting preventive care can help to protect your health and wellness after age 69. Screening and testing are the best way to find a health problem early and help you avoid having a fall. Early diagnosis and treatment give you the best chance for managing medical conditions that are more common for people who are older than age 69. Falls are a major cause of broken bones and head injuries in people who are older than age 69. Take precautions to prevent a fall at home. Work with your health care provider to learn what changes you can make to improve your health and wellness and to prevent falls. This information is not intended to replace advice given to you by your health care provider. Make sure you discuss any questions you have with your health care provider. Document Revised: 08/28/2020 Document Reviewed: 08/28/2020 Elsevier Patient Education  2023 [employer]  

## 2022-08-07 LAB — CMP14+EGFR
ALT: 33 IU/L (ref 0–44)
AST: 29 IU/L (ref 0–40)
Albumin/Globulin Ratio: 2 (ref 1.2–2.2)
Albumin: 4.5 g/dL (ref 3.9–4.9)
Alkaline Phosphatase: 98 IU/L (ref 44–121)
BUN/Creatinine Ratio: 25 — ABNORMAL HIGH (ref 10–24)
BUN: 23 mg/dL (ref 8–27)
Bilirubin Total: 0.5 mg/dL (ref 0.0–1.2)
CO2: 17 mmol/L — ABNORMAL LOW (ref 20–29)
Calcium: 9.5 mg/dL (ref 8.6–10.2)
Chloride: 102 mmol/L (ref 96–106)
Creatinine, Ser: 0.93 mg/dL (ref 0.76–1.27)
Globulin, Total: 2.2 g/dL (ref 1.5–4.5)
Glucose: 128 mg/dL — ABNORMAL HIGH (ref 70–99)
Potassium: 4.1 mmol/L (ref 3.5–5.2)
Sodium: 140 mmol/L (ref 134–144)
Total Protein: 6.7 g/dL (ref 6.0–8.5)
eGFR: 89 mL/min/{1.73_m2} (ref >59)

## 2022-08-08 ENCOUNTER — Other Ambulatory Visit: Payer: Self-pay | Admitting: Family

## 2022-08-14 ENCOUNTER — Other Ambulatory Visit (HOSPITAL_COMMUNITY): Payer: Self-pay | Admitting: Urology

## 2022-08-14 DIAGNOSIS — R9721 Rising PSA following treatment for malignant neoplasm of prostate: Secondary | ICD-10-CM

## 2022-08-18 ENCOUNTER — Other Ambulatory Visit: Payer: Self-pay | Admitting: Family

## 2022-08-28 ENCOUNTER — Other Ambulatory Visit: Payer: Self-pay | Admitting: Family

## 2022-09-02 ENCOUNTER — Encounter (HOSPITAL_COMMUNITY)
Admission: RE | Admit: 2022-09-02 | Discharge: 2022-09-02 | Disposition: A | Discharge status: Home / Self Care | Payer: Medicare Other | Source: Ambulatory Visit | Attending: Urology | Admitting: Urology

## 2022-09-02 DIAGNOSIS — R9721 Rising PSA following treatment for malignant neoplasm of prostate: Secondary | ICD-10-CM | POA: Insufficient documentation

## 2022-09-02 MED ORDER — PIFLIFOLASTAT F 18 (PYLARIFY) INJECTION
9.0000 | Freq: Once | INTRAVENOUS | Status: AC
  Administered 2022-09-02: 9.59 via INTRAVENOUS

## 2022-09-12 NOTE — Progress Notes (Signed)
GU Location of Tumor / Histology: Prostate Ca metastatic to peri-aortic lymph node  PSA is (0.44 on 12/24/2021 & 0.78 on 07/30/2022)   Prostatectomy (2014)     Past/Anticipated interventions by urology, if any:     Past/Anticipated interventions by medical oncology, if any: NA  Weight changes, if any:  No  IPSS:  10 SHIM:   5  Bowel/Bladder complaints, if any:  No, is taking Flomax for urinary isssues.  Nausea/Vomiting, if any:  No  Pain issues, if any:  0/10  SAFETY ISSUES: Prior radiation? Yes, Prostate (external beam radiation 10/2018) Pacemaker/ICD?  No Possible current pregnancy? Male Is the patient on methotrexate? No  Current Complaints / other details:  No

## 2022-09-14 ENCOUNTER — Other Ambulatory Visit: Payer: Self-pay | Admitting: Family

## 2022-09-17 ENCOUNTER — Encounter: Payer: Self-pay | Admitting: Radiation Oncology

## 2022-09-17 ENCOUNTER — Ambulatory Visit
Admission: RE | Admit: 2022-09-17 | Discharge: 2022-09-17 | Disposition: A | Discharge status: Home / Self Care | Payer: Medicare Other | Source: Ambulatory Visit | Attending: Radiation Oncology | Admitting: Radiation Oncology

## 2022-09-17 VITALS — BP 126/81 | HR 81 | Temp 97.3°F | Resp 20 | Ht 71.0 in | Wt 194.6 lb

## 2022-09-17 DIAGNOSIS — C61 Malignant neoplasm of prostate: Secondary | ICD-10-CM

## 2022-09-17 DIAGNOSIS — E785 Hyperlipidemia, unspecified: Secondary | ICD-10-CM | POA: Insufficient documentation

## 2022-09-17 DIAGNOSIS — Z803 Family history of malignant neoplasm of breast: Secondary | ICD-10-CM | POA: Insufficient documentation

## 2022-09-17 DIAGNOSIS — Z923 Personal history of irradiation: Secondary | ICD-10-CM | POA: Diagnosis not present

## 2022-09-17 DIAGNOSIS — Z8052 Family history of malignant neoplasm of bladder: Secondary | ICD-10-CM | POA: Insufficient documentation

## 2022-09-17 DIAGNOSIS — K760 Fatty (change of) liver, not elsewhere classified: Secondary | ICD-10-CM | POA: Insufficient documentation

## 2022-09-17 DIAGNOSIS — M797 Fibromyalgia: Secondary | ICD-10-CM | POA: Diagnosis not present

## 2022-09-17 DIAGNOSIS — K219 Gastro-esophageal reflux disease without esophagitis: Secondary | ICD-10-CM | POA: Diagnosis not present

## 2022-09-17 DIAGNOSIS — I10 Essential (primary) hypertension: Secondary | ICD-10-CM | POA: Insufficient documentation

## 2022-09-17 DIAGNOSIS — R911 Solitary pulmonary nodule: Secondary | ICD-10-CM | POA: Insufficient documentation

## 2022-09-17 DIAGNOSIS — Z801 Family history of malignant neoplasm of trachea, bronchus and lung: Secondary | ICD-10-CM | POA: Diagnosis not present

## 2022-09-17 DIAGNOSIS — Z87891 Personal history of nicotine dependence: Secondary | ICD-10-CM | POA: Diagnosis not present

## 2022-09-17 DIAGNOSIS — Z79899 Other long term (current) drug therapy: Secondary | ICD-10-CM | POA: Insufficient documentation

## 2022-09-17 NOTE — Progress Notes (Signed)
Radiation Oncology         [phone] ________________________________  Outpatient Re-Consultation  Name: Jonathan Washington MRN: [id number]  Date: 09/17/2022  DOB: Jun 10, 1955  WU:JWJXB, Edilia Bo, FNP  Crist Fat, MD   REFERRING PHYSICIAN: Crist Fat, MD  DIAGNOSIS: 67 y.o. gentleman with a PSMA-avid periaortic lymph node s/p RALP 05/2012 and EBRT 09/2018 for Stage pT2bN1, Gleason 3+3 prostate cancer with rising PSA of 0.78    ICD-10-CM   1. Malignant neoplasm of prostate (HCC)  C61       HISTORY OF PRESENT ILLNESS: Jonathan Washington is a 67 y.o. male returning today for evaluation following recent restaging PSMA PET scan. In summary, his prostate cancer was originally diagnosed in November 2013 as Gleason 3+4 with pre-treatment PSA of 3.7. He underwent radical prostatectomy and bilateral lymphadenectomy with Dr. Isabel Caprice on 05/27/2012. Final pathology revealed pT2b, Gleason 3+3 prostatic adenocarcinoma involving the left lobe with ECE, PNI, LVI and one left pelvic lymph node positive for metastatic disease (pN1). Surgical margins were negative and there was no seminal vesicle involvement. His initial post-operative PSA was 0.04. His case was presented at the multidisciplinary prostate cancer tumor board regarding adjuvant prostate fossa radiation, but no strong consensus was reached. After further discussion with his urologist, the decision was to proceed with close monitoring of the PSA.  His subsequent PSA values remained stable around 0.04 until January 2016, when it increased to 0.07 and continued to gradually rise to 0.36 by 05/2018. He underwent Axumin PET scan on 08/25/2018 showing no evidence of recurrence or metastatic disease. We met the patient on 08/11/2018 and again on 09/01/2018 following his PET scan. Despite the negative results, the recommendation was to move forward with salvage radiation therapy to the prostatic fossa and pelvic lymph nodes. A repeat PSA obtained  a week later showed a further rise to 0.48. He agreed to proceed and received external beam radiation from 09/23/2018 through 11/16/2018.  His PSA reached a nadir of 0.044 in 11/2019 and remained relatively stable through 12/2020, at which time it was 0.069. Following this, his PSA jumped to 0.24 in 06/2021 and continued to rise, most recently reaching 0.78 on 07/29/22. This prompted a PSMA PET scan on 09/02/22, which revealed: single radiotracer-avid periaortic lymph node; no evidence of local recurrence, pelvic lymphadenopathy, visceral metastasis, or skeletal metastasis. A small RML pulmonary nodule was noted but favored to be unrelated to prostate cancer.    The patient has kindly been referred today for discussion of potential radiation treatment to the [facility] lymph node.   PREVIOUS RADIATION THERAPY: Yes  09/23/18 - 11/16/18: 1. The prostate fossa and pelvic lymph nodes were initially treated to 45 Gy in 25 fractions of 1.8 Gy  2. The prostate fossa only was boosted to 68.4 Gy with 13 additional fractions of 1.8 Gy   PAST MEDICAL HISTORY:  Past Medical History:  Diagnosis Date   Cataract    Dyslipidemia    Fatty liver    Fibromyalgia    GERD (gastroesophageal reflux disease)    Glaucoma    RIGHT EYE   Glucose intolerance (impaired glucose tolerance)    Hypertension    Pneumonia    4 YRS AGO   Prostate cancer (HCC)    Vertigo    ESPECIALLY IF PT TURNS HIS HEAD OR MOVES A CERTAIN WAY      PAST SURGICAL HISTORY: Past Surgical History:  Procedure Laterality Date   CHOLECYSTECTOMY     EYE SURGERY  Bilateral    cataracts   LEFT KNEE ARTHROSCOPY     LYMPHADENECTOMY  05/27/2012   Procedure: LYMPHADENECTOMY;  Surgeon: Valetta Fuller, MD;  Location: [facility];  Service: Urology;  Laterality: Bilateral;   ROBOT ASSISTED LAPAROSCOPIC RADICAL PROSTATECTOMY  05/27/2012   Procedure: ROBOTIC ASSISTED LAPAROSCOPIC RADICAL PROSTATECTOMY;  Surgeon: Valetta Fuller, MD;  Location: [facility];  Service:  Urology;  Laterality: N/A;   SURGERY FOR DEVIATED NASAL SEPTUM      FAMILY HISTORY:  Family History  Problem Relation Age of Onset   Breast cancer Mother    Bladder Cancer Father    Throat cancer Father    Lung cancer Paternal Aunt        non smoker    SOCIAL HISTORY:  Social History   Socioeconomic History   Marital status: Married    Spouse name: Not on file   Number of children: 2   Years of education: Not on file   Highest education level: Not on file  Occupational History    Comment: retired since 2015  Tobacco Use   Smoking status: Former    Packs/day: 2.00    Years: 15.00    Additional pack years: 0.00    Total pack years: 30.00    Types: Cigarettes    Quit date: 04/22/1978    Years since quitting: 44.4   Smokeless tobacco: Never   Tobacco comments:    QUIT SMOKING 1980  Vaping Use   Vaping Use: Never used  Substance and Sexual Activity   Alcohol use: Yes    Alcohol/week: 2.0 standard drinks of alcohol    Types: 2 Cans of beer per week   Drug use: No   Sexual activity: Yes    Comment: with aid of Viagra  Other Topics Concern   Not on file  Social History Narrative   Not on file   Social Determinants of Health   Financial Resource Strain: Low Risk  (05/16/2022)   Overall Financial Resource Strain (CARDIA)    Difficulty of Paying Living Expenses: Not hard at all  Food Insecurity: No Food Insecurity (05/16/2022)   Hunger Vital Sign    Worried About Running Out of Food in the Last Year: Never true    Ran Out of Food in the Last Year: Never true  Transportation Needs: No Transportation Needs (05/16/2022)   PRAPARE - Administrator, Civil Service (Medical): No    Lack of Transportation (Non-Medical): No  Physical Activity: Insufficiently Active (05/16/2022)   Exercise Vital Sign    Days of Exercise per Week: 2 days    Minutes of Exercise per Session: 60 min  Stress: No Stress Concern Present (05/16/2022)   [employer] of Occupational  Health - Occupational Stress Questionnaire    Feeling of Stress : Not at all  Social Connections: Socially Integrated (05/16/2022)   Social Connection and Isolation Panel [NHANES]    Frequency of Communication with Friends and Family: More than three times a week    Frequency of Social Gatherings with Friends and Family: More than three times a week    Attends Religious Services: More than 4 times per year    Active Member of [employer] or Organizations: Yes    Attends Banker Meetings: More than 4 times per year    Marital Status: Married  Catering manager Violence: Not At Risk (05/16/2022)   Humiliation, Afraid, Rape, and Kick questionnaire    Fear of Current or Ex-Partner: No  Emotionally Abused: No    Physically Abused: No    Sexually Abused: No    ALLERGIES: Niacin  MEDICATIONS:  Current Outpatient Medications  Medication Sig Dispense Refill   amLODipine (NORVASC) 2.5 MG tablet Take 1 tablet by mouth once daily 90 tablet 1   aspirin 81 MG tablet Take 81 mg by mouth.     Continuous Blood Gluc Sensor (FREESTYLE LIBRE 2 SENSOR) MISC USE AS DIRECTED EVERY  14  DAYS     fenofibrate 160 MG tablet Take 1 tablet by mouth once daily 90 tablet 1   icosapent Ethyl (VASCEPA) 1 g capsule Take 2 capsules (2 g total) by mouth 2 (two) times daily. 120 capsule 2   imipramine (TOFRANIL) 10 MG tablet TAKE 1 TABLET BY MOUTH AT BEDTIME 90 tablet 0   JARDIANCE 10 MG TABS tablet Take 10 mg by mouth daily.     losartan (COZAAR) 100 MG tablet Take 1 tablet by mouth once daily 90 tablet 1   metFORMIN (GLUCOPHAGE-XR) 500 MG 24 hr tablet Take 500 mg by mouth 3 (three) times daily.     omeprazole (PRILOSEC) 20 MG capsule Take 20 mg by mouth daily as needed. For heartburn     rosuvastatin (CRESTOR) 5 MG tablet Take 1 tablet by mouth once daily 90 tablet 1   sildenafil (VIAGRA) 100 MG tablet Take 100 mg by mouth daily as needed for erectile dysfunction.     tamsulosin (FLOMAX) 0.4 MG CAPS capsule  Take 0.4 mg by mouth daily.     timolol (TIMOPTIC) 0.5 % ophthalmic solution Place 1 drop into the right eye 2 (two) times daily.     No current facility-administered medications for this encounter.    REVIEW OF SYSTEMS:  On review of systems, the patient reports that he is doing well overall. He denies any chest pain, shortness of breath, cough, fevers, chills, night sweats, unintended weight changes. He denies any bowel disturbances, and denies abdominal pain, nausea or vomiting. He denies any new musculoskeletal or joint aches or pains. His IPSS was 10, indicating mild urinary symptoms. He endorses taking Flomax. His SHIM was 5 but uses sildenafil with success, indicating he has well-controlled erectile dysfunction. A complete review of systems is obtained and is otherwise negative.    PHYSICAL EXAM:  Wt Readings from Last 3 Encounters:  09/17/22 194 lb 9.6 oz (88.3 kg)  08/06/22 195 lb 6.4 oz (88.6 kg)  05/16/22 187 lb (84.8 kg)   Temp Readings from Last 3 Encounters:  09/17/22 (!) 97.3 F (36.3 C)  08/06/22 98.1 F (36.7 C) (Temporal)  02/04/22 (!) 97 F (36.1 C) (Temporal)   BP Readings from Last 3 Encounters:  09/17/22 126/81  08/06/22 126/79  02/04/22 120/77   Pulse Readings from Last 3 Encounters:  09/17/22 81  08/06/22 91  02/04/22 82    /10  In general this is a well appearing man in no acute distress. He's alert and oriented x4 and appropriate throughout the examination. Cardiopulmonary assessment is negative for acute distress, and he exhibits normal effort.     KPS = 100  100 - Normal; no complaints; no evidence of disease. 90   - Able to carry on normal activity; minor signs or symptoms of disease. 80   - Normal activity with effort; some signs or symptoms of disease. 13   - Cares for self; unable to carry on normal activity or to do active work. 60   - Requires occasional assistance, but is  able to care for most of his personal needs. 50   - Requires  considerable assistance and frequent medical care. 40   - Disabled; requires special care and assistance. 30   - Severely disabled; hospital admission is indicated although death not imminent. 20   - Very sick; hospital admission necessary; active supportive treatment necessary. 10   - Moribund; fatal processes progressing rapidly. 0     - Dead  Karnofsky DA, Abelmann WH, Craver LS and Burchenal JH [phone]) The use of the nitrogen mustards in the palliative treatment of carcinoma: with particular reference to bronchogenic carcinoma Cancer 1 634-56  LABORATORY DATA:  Lab Results  Component Value Date   WBC 4.9 02/04/2022   HGB 16.3 02/04/2022   HCT 47.4 02/04/2022   MCV 93 02/04/2022   PLT 247 02/04/2022   Lab Results  Component Value Date   NA 140 08/06/2022   K 4.1 08/06/2022   CL 102 08/06/2022   CO2 17 (L) 08/06/2022   Lab Results  Component Value Date   ALT 33 08/06/2022   AST 29 08/06/2022   ALKPHOS 98 08/06/2022   BILITOT 0.5 08/06/2022     RADIOGRAPHY: NM PET (PSMA) SKULL TO MID THIGH  Result Date: 09/04/2022 CLINICAL DATA:  Prostate carcinoma. Prostatectomy lymphadenectomy. Rising PSA. EXAM: NUCLEAR MEDICINE PET SKULL BASE TO THIGH TECHNIQUE: 9.6 mCi F18 Piflufolastat (Pylarify) was injected intravenously. Full-ring PET imaging was performed from the skull base to thigh after the radiotracer. CT data was obtained and used for attenuation correction and anatomic localization. COMPARISON:  None Available. FINDINGS: NECK No radiotracer activity in neck lymph nodes. Incidental CT finding: None. CHEST No radiotracer accumulation within mediastinal or hilar lymph nodes. No suspicious pulmonary nodules on the CT scan. Incidental CT finding: 3 mm RIGHT middle lobe nodule (image 30/7) ABDOMEN/PELVIS Prostate: No focal activity in the prostatectomy bed. Lymph nodes: No radiotracer avid pelvic lymph nodes. Single radiotracer avid periaortic lymph node position between the IVC and aorta  on image 175 fused data set. This small node measures 7 mm (174/CT series 4) with intense radiotracer activity for size SUV 8.0 Liver: No evidence of liver metastasis. Incidental CT finding: None. SKELETON No focal activity to suggest skeletal metastasis. IMPRESSION: 1. Single radiotracer avid periaortic lymph node consistent with prostate cancer nodal metastasis. 2. No evidence of local recurrence in the prostatectomy bed. No pelvic lymphadenopathy. 3. No evidence of visceral metastasis or skeletal metastasis. 4. Small RIGHT middle lobe pulmonary nodule. Favor unrelated to prostate cancer. No follow-up recommended unless risk factors for lung cancer. Electronically Signed   By: Genevive Bi M.D.   On: 09/04/2022 11:58      IMPRESSION/PLAN: 1. 67 y.o. gentleman with a PSMA-avid periaortic lymph node s/p RALP 05/2012 and EBRT 09/2018 for Stage pT2bN1, Gleason 3+3 prostate cancer Today, we talked to the patient and family about the findings and workup thus far. We discussed the natural history of prostate cancer and general treatment, highlighting the role of radiotherapy in the management of metastatic disease. We discussed the available radiation techniques, and focused on the details and logistics of delivery. In this case, our recommendation is for 10 fractions of Ultrahypofractionated Radiotherapy [facility]) treating the PET avid node to 50 Gy and involved paraaortic nodal echelon to 30 Gy. We reviewed the anticipated acute and late sequelae associated with radiation in this setting. The patient was encouraged to ask questions that were answered to his satisfaction.  At the conclusion of our conversation, the patient  is interested in moving forward with CT simulation on Friday 5/31 at 2 pm. We will share our discussion with Dr. Marlou Porch. The patient appears to have a good understanding of his disease and our treatment recommendation and is in agreement with the stated plan.  Therefore, we will move forward  with treatment planning accordingly, in anticipation of beginning IMRT in the near future.  I personally spent 60 minutes in this encounter including chart review, reviewing radiological studies, meeting face-to-face with the patient, entering orders and completing documentation.    ------------------------------------------------   Margaretmary Dys, MD [facility]  Radiation Oncology Direct Dial: [phone]  Fax: [phone] [email or web address]  Skype  LinkedIn   This document serves as a record of services personally performed by Margaretmary Dys, MD. It was created on his behalf by Mickie Bail, a trained medical scribe. The creation of this record is based on the scribe's personal observations and the provider's statements to them. This document has been checked and approved by the attending provider.

## 2022-09-17 NOTE — Progress Notes (Signed)
Introduced myself to the patient as the prostate nurse navigator.  No barriers to care identified at this time.  He is here to discuss his radiation treatment options, and has previously had a prostatectomy and radiation to the prostate fossa.  I gave him my business card and asked him to call me with questions or concerns.  Verbalized understanding.

## 2022-09-18 ENCOUNTER — Other Ambulatory Visit: Payer: Self-pay | Admitting: Family

## 2022-09-20 ENCOUNTER — Ambulatory Visit
Admission: RE | Admit: 2022-09-20 | Discharge: 2022-09-20 | Disposition: A | Discharge status: Home / Self Care | Payer: Medicare Other | Source: Ambulatory Visit | Attending: Radiation Oncology | Admitting: Radiation Oncology

## 2022-09-20 DIAGNOSIS — C61 Malignant neoplasm of prostate: Secondary | ICD-10-CM | POA: Diagnosis present

## 2022-09-20 NOTE — Progress Notes (Signed)
  Radiation Oncology         [phone] ________________________________  Name: Jonathan Washington MRN: [id number]  Date: 09/20/2022  DOB: April 15, 1956  ULTRAHYPOFRACTIONATED RADIOTHERAPY  SIMULATION AND TREATMENT PLANNING NOTE    ICD-10-CM   1. Recurrent adenocarcinoma of prostate (HCC)  C61     2. Malignant neoplasm of prostate metastatic to intrapelvic lymph node (HCC)  C61    C77.5       DIAGNOSIS:  67 y.o. gentleman with a PSMA-avid periaortic lymph node s/p RALP 05/2012 and EBRT 09/2018 for Stage pT2bN1, Gleason 3+3 prostate cancer with rising PSA of 0.78   NARRATIVE:  The patient was brought to the CT Simulation planning suite.  Identity was confirmed.  All relevant records and images related to the planned course of therapy were reviewed.  The patient freely provided informed written consent to proceed with treatment after reviewing the details related to the planned course of therapy. The consent form was witnessed and verified by the simulation staff.  Then, the patient was set-up in a stable reproducible  supine position for radiation therapy.  A BodyFix immobilization pillow was fabricated for reproducible positioning.  Surface markings were placed.  The CT images were loaded into the planning software.  The gross target volumes (GTV) and planning target volumes (PTV) were delinieated, and avoidance structures were contoured.  Treatment planning then occurred.  The radiation prescription was entered and confirmed.  A total of two complex treatment devices were fabricated in the form of the BodyFix immobilization pillow and a neck accuform cushion.  I have requested : 3D Simulation  I have requested a DVH of the following structures: targets and all normal structures near the target including kidneys, bowel, spinal cord, skin, targets and others as noted on the radiation plan to maintain doses in adherence with established limits  SPECIAL TREATMENT PROCEDURE:  The planned course of  therapy using radiation constitutes a special treatment procedure. Special care is required in the management of this patient for the following reasons. High dose per fraction requiring special monitoring for increased toxicities of treatment including daily imaging..  The special nature of the planned course of radiotherapy will require increased physician supervision and oversight to ensure patient's safety with optimal treatment outcomes.    This requires extended time and effort.    PLAN:  The patient will receive 50 Gy in 10 fractions to the solitary PET avid node periaortic node while simultaneously treating the regional nodal echelon to 30 Gy in 10 fractions.  ________________________________  Artist Pais Kathrynn Running, M.D.

## 2022-10-03 ENCOUNTER — Ambulatory Visit
Admission: RE | Admit: 2022-10-03 | Discharge: 2022-10-03 | Disposition: A | Discharge status: Home / Self Care | Payer: Medicare Other | Source: Ambulatory Visit | Attending: Radiation Oncology | Admitting: Radiation Oncology

## 2022-10-03 ENCOUNTER — Other Ambulatory Visit: Payer: Self-pay

## 2022-10-03 DIAGNOSIS — C61 Malignant neoplasm of prostate: Secondary | ICD-10-CM | POA: Insufficient documentation

## 2022-10-03 DIAGNOSIS — Z510 Encounter for antineoplastic radiation therapy: Secondary | ICD-10-CM | POA: Insufficient documentation

## 2022-10-03 DIAGNOSIS — C775 Secondary and unspecified malignant neoplasm of intrapelvic lymph nodes: Secondary | ICD-10-CM | POA: Insufficient documentation

## 2022-10-03 NOTE — Progress Notes (Signed)
  Radiation Oncology         [phone] ________________________________  Name: Jonathan Washington MRN: [id number]  Date: 10/03/2022  DOB: February 09, 1956  ULTRAHYPOFRACTIONATED RADIOTHERAPY  SIMULATION NOTE    ICD-10-CM   1. Malignant neoplasm of prostate metastatic to intrapelvic lymph node (HCC)  C61    C77.5       DIAGNOSIS:   67 y.o. gentleman with a PSMA-avid periaortic lymph node s/p RALP 05/2012 and EBRT 09/2018 for Stage pT2bN1, Gleason 3+3 prostate cancer with rising PSA of 0.78  NARRATIVE:  The patient was brought to the CT Simulation planning suite.  He underwent CT simulation for [facility] and there was a loop of small bowel near his PET avid paraaortic lymph node, limiting the dose to the target.  He was brought back for CT imaging today in order to determine whether that loop of bowel was fixed in its location or mobile.  He was set back up in his planned position and re-imaged.  These images were uploaded and fused with his planning CT.  I confirmed that his small bowel position was different and mobile, so, we will not sacrifice target coverage to spare this one bowel area    ________________________________  Artist Pais. Kathrynn Running, M.D.

## 2022-10-07 ENCOUNTER — Other Ambulatory Visit: Payer: Self-pay

## 2022-10-07 ENCOUNTER — Ambulatory Visit
Admission: RE | Admit: 2022-10-07 | Discharge: 2022-10-07 | Disposition: A | Discharge status: Home / Self Care | Payer: Medicare Other | Source: Ambulatory Visit | Attending: Radiation Oncology | Admitting: Radiation Oncology

## 2022-10-07 DIAGNOSIS — C61 Malignant neoplasm of prostate: Secondary | ICD-10-CM

## 2022-10-07 DIAGNOSIS — Z510 Encounter for antineoplastic radiation therapy: Secondary | ICD-10-CM | POA: Diagnosis not present

## 2022-10-07 LAB — RAD ONC ARIA SESSION SUMMARY
Course Elapsed Days: 0
Plan Fractions Treated to Date: 1
Plan Prescribed Dose Per Fraction: 5 Gy
Plan Total Fractions Prescribed: 10
Plan Total Prescribed Dose: 50 Gy
Reference Point Dosage Given to Date: 5 Gy
Reference Point Session Dosage Given: 5 Gy
Session Number: 1

## 2022-10-08 ENCOUNTER — Ambulatory Visit
Admission: RE | Admit: 2022-10-08 | Discharge: 2022-10-08 | Disposition: A | Discharge status: Home / Self Care | Payer: Medicare Other | Source: Ambulatory Visit | Attending: Radiation Oncology

## 2022-10-08 ENCOUNTER — Other Ambulatory Visit: Payer: Self-pay

## 2022-10-08 DIAGNOSIS — Z510 Encounter for antineoplastic radiation therapy: Secondary | ICD-10-CM | POA: Diagnosis not present

## 2022-10-08 LAB — RAD ONC ARIA SESSION SUMMARY
Course Elapsed Days: 1
Plan Fractions Treated to Date: 2
Plan Prescribed Dose Per Fraction: 5 Gy
Plan Total Fractions Prescribed: 10
Plan Total Prescribed Dose: 50 Gy
Reference Point Dosage Given to Date: 10 Gy
Reference Point Session Dosage Given: 5 Gy
Session Number: 2

## 2022-10-09 ENCOUNTER — Ambulatory Visit
Admission: RE | Admit: 2022-10-09 | Discharge: 2022-10-09 | Disposition: A | Discharge status: Home / Self Care | Payer: Medicare Other | Source: Ambulatory Visit | Attending: Radiation Oncology

## 2022-10-09 ENCOUNTER — Other Ambulatory Visit: Payer: Self-pay

## 2022-10-09 DIAGNOSIS — Z510 Encounter for antineoplastic radiation therapy: Secondary | ICD-10-CM | POA: Diagnosis not present

## 2022-10-09 LAB — RAD ONC ARIA SESSION SUMMARY
Course Elapsed Days: 2
Plan Fractions Treated to Date: 3
Plan Prescribed Dose Per Fraction: 5 Gy
Plan Total Fractions Prescribed: 10
Plan Total Prescribed Dose: 50 Gy
Reference Point Dosage Given to Date: 15 Gy
Reference Point Session Dosage Given: 5 Gy
Session Number: 3

## 2022-10-10 ENCOUNTER — Other Ambulatory Visit: Payer: Self-pay

## 2022-10-10 ENCOUNTER — Ambulatory Visit
Admission: RE | Admit: 2022-10-10 | Discharge: 2022-10-10 | Disposition: A | Discharge status: Home / Self Care | Payer: Medicare Other | Source: Ambulatory Visit | Attending: Radiation Oncology | Admitting: Radiation Oncology

## 2022-10-10 DIAGNOSIS — Z510 Encounter for antineoplastic radiation therapy: Secondary | ICD-10-CM | POA: Diagnosis not present

## 2022-10-10 LAB — RAD ONC ARIA SESSION SUMMARY
Course Elapsed Days: 3
Plan Fractions Treated to Date: 4
Plan Prescribed Dose Per Fraction: 5 Gy
Plan Total Fractions Prescribed: 10
Plan Total Prescribed Dose: 50 Gy
Reference Point Dosage Given to Date: 20 Gy
Reference Point Session Dosage Given: 5 Gy
Session Number: 4

## 2022-10-11 ENCOUNTER — Other Ambulatory Visit: Payer: Self-pay | Admitting: Radiation Oncology

## 2022-10-11 ENCOUNTER — Ambulatory Visit
Admission: RE | Admit: 2022-10-11 | Discharge: 2022-10-11 | Disposition: A | Discharge status: Home / Self Care | Payer: Medicare Other | Source: Ambulatory Visit | Attending: Radiation Oncology | Admitting: Radiation Oncology

## 2022-10-11 ENCOUNTER — Other Ambulatory Visit: Payer: Self-pay

## 2022-10-11 DIAGNOSIS — Z510 Encounter for antineoplastic radiation therapy: Secondary | ICD-10-CM | POA: Diagnosis not present

## 2022-10-11 LAB — RAD ONC ARIA SESSION SUMMARY
Course Elapsed Days: 4
Plan Fractions Treated to Date: 5
Plan Prescribed Dose Per Fraction: 5 Gy
Plan Total Fractions Prescribed: 10
Plan Total Prescribed Dose: 50 Gy
Reference Point Dosage Given to Date: 25 Gy
Reference Point Session Dosage Given: 5 Gy
Session Number: 5

## 2022-10-11 MED ORDER — ONDANSETRON HCL 8 MG PO TABS: 8.0000 mg | ORAL_TABLET | Freq: Three times a day (TID) | ORAL | 0 refills | Status: DC | PRN

## 2022-10-14 ENCOUNTER — Other Ambulatory Visit: Payer: Self-pay

## 2022-10-14 ENCOUNTER — Ambulatory Visit
Admission: RE | Admit: 2022-10-14 | Discharge: 2022-10-14 | Disposition: A | Discharge status: Home / Self Care | Payer: Medicare Other | Source: Ambulatory Visit | Attending: Radiation Oncology | Admitting: Radiation Oncology

## 2022-10-14 DIAGNOSIS — Z510 Encounter for antineoplastic radiation therapy: Secondary | ICD-10-CM | POA: Diagnosis not present

## 2022-10-14 LAB — RAD ONC ARIA SESSION SUMMARY
Course Elapsed Days: 7
Plan Fractions Treated to Date: 6
Plan Prescribed Dose Per Fraction: 5 Gy
Plan Total Fractions Prescribed: 10
Plan Total Prescribed Dose: 50 Gy
Reference Point Dosage Given to Date: 30 Gy
Reference Point Session Dosage Given: 5 Gy
Session Number: 6

## 2022-10-15 ENCOUNTER — Ambulatory Visit
Admission: RE | Admit: 2022-10-15 | Discharge: 2022-10-15 | Disposition: A | Discharge status: Home / Self Care | Payer: Medicare Other | Source: Ambulatory Visit | Attending: Radiation Oncology

## 2022-10-15 ENCOUNTER — Other Ambulatory Visit: Payer: Self-pay

## 2022-10-15 DIAGNOSIS — Z510 Encounter for antineoplastic radiation therapy: Secondary | ICD-10-CM | POA: Diagnosis not present

## 2022-10-15 LAB — RAD ONC ARIA SESSION SUMMARY
Course Elapsed Days: 8
Plan Fractions Treated to Date: 7
Plan Prescribed Dose Per Fraction: 5 Gy
Plan Total Fractions Prescribed: 10
Plan Total Prescribed Dose: 50 Gy
Reference Point Dosage Given to Date: 35 Gy
Reference Point Session Dosage Given: 5 Gy
Session Number: 7

## 2022-10-16 ENCOUNTER — Other Ambulatory Visit: Payer: Self-pay | Admitting: Family

## 2022-10-16 ENCOUNTER — Other Ambulatory Visit: Payer: Self-pay

## 2022-10-16 ENCOUNTER — Ambulatory Visit
Admission: RE | Admit: 2022-10-16 | Discharge: 2022-10-16 | Disposition: A | Discharge status: Home / Self Care | Payer: Medicare Other | Source: Ambulatory Visit | Attending: Radiation Oncology

## 2022-10-16 DIAGNOSIS — Z510 Encounter for antineoplastic radiation therapy: Secondary | ICD-10-CM | POA: Diagnosis not present

## 2022-10-16 LAB — RAD ONC ARIA SESSION SUMMARY
Course Elapsed Days: 9
Plan Fractions Treated to Date: 8
Plan Prescribed Dose Per Fraction: 5 Gy
Plan Total Fractions Prescribed: 10
Plan Total Prescribed Dose: 50 Gy
Reference Point Dosage Given to Date: 40 Gy
Reference Point Session Dosage Given: 5 Gy
Session Number: 8

## 2022-10-17 ENCOUNTER — Other Ambulatory Visit: Payer: Self-pay

## 2022-10-17 ENCOUNTER — Ambulatory Visit
Admission: RE | Admit: 2022-10-17 | Discharge: 2022-10-17 | Disposition: A | Discharge status: Home / Self Care | Payer: Medicare Other | Source: Ambulatory Visit | Attending: Radiation Oncology | Admitting: Radiation Oncology

## 2022-10-17 DIAGNOSIS — Z510 Encounter for antineoplastic radiation therapy: Secondary | ICD-10-CM | POA: Diagnosis not present

## 2022-10-17 LAB — RAD ONC ARIA SESSION SUMMARY
Course Elapsed Days: 10
Plan Fractions Treated to Date: 9
Plan Prescribed Dose Per Fraction: 5 Gy
Plan Total Fractions Prescribed: 10
Plan Total Prescribed Dose: 50 Gy
Reference Point Dosage Given to Date: 45 Gy
Reference Point Session Dosage Given: 5 Gy
Session Number: 9

## 2022-10-18 ENCOUNTER — Other Ambulatory Visit: Payer: Self-pay

## 2022-10-18 ENCOUNTER — Ambulatory Visit
Admission: RE | Admit: 2022-10-18 | Discharge: 2022-10-18 | Disposition: A | Discharge status: Home / Self Care | Payer: Medicare Other | Source: Ambulatory Visit | Attending: Radiation Oncology

## 2022-10-18 ENCOUNTER — Ambulatory Visit
Admission: RE | Admit: 2022-10-18 | Discharge: 2022-10-18 | Disposition: A | Discharge status: Home / Self Care | Payer: Medicare Other | Source: Ambulatory Visit | Attending: Radiation Oncology | Admitting: Radiation Oncology

## 2022-10-18 DIAGNOSIS — Z510 Encounter for antineoplastic radiation therapy: Secondary | ICD-10-CM | POA: Diagnosis not present

## 2022-10-18 LAB — RAD ONC ARIA SESSION SUMMARY
Course Elapsed Days: 11
Plan Fractions Treated to Date: 10
Plan Prescribed Dose Per Fraction: 5 Gy
Plan Total Fractions Prescribed: 10
Plan Total Prescribed Dose: 50 Gy
Reference Point Dosage Given to Date: 50 Gy
Reference Point Session Dosage Given: 5 Gy
Session Number: 10

## 2022-10-21 NOTE — Radiation Completion Notes (Addendum)
  Radiation Oncology         [phone] ________________________________  Name: Jonathan Washington MRN: [id number]  Date: 10/18/2022  DOB: 03-28-1956  Referring Physician: Berniece Salines, M.D. Date of Service: 2022-10-21 Radiation Oncologist: Margaretmary Bayley, M.D. [facility] -      RADIATION ONCOLOGY END OF TREATMENT NOTE     Diagnosis: 67 y.o. gentleman with a PSMA-avid periaortic lymph node s/p RALP 05/2012 and EBRT 09/2018 for Stage pT2bN1, Gleason 3+3 prostate cancer with rising PSA of 0.78   Intent: Curative     ==========DELIVERED PLANS==========  First Treatment Date: 2022-10-07 - Last Treatment Date: 2022-10-18   Plan Name: Abd_UHRT Site: Abdomen Technique: IMRT Mode: Photon Dose Per Fraction: 5 Gy Prescribed Dose (Delivered / Prescribed): 50 Gy / 50 Gy Prescribed Fxs (Delivered / Prescribed): 10 / 10     ==========ON TREATMENT VISIT DATES========== 2022-10-11, 2022-10-18    See weekly On Treatment Notes in Epic for details.  He tolerated the radiation treatments fairly well with some mild nausea that was managed with Zofran as needed and increased nocturia which was managed with Flomax daily.  The patient will receive a call in about one month from the radiation oncology department. He will continue follow up with his urologist, Dr. Marlou Porch as well.  ------------------------------------------------   Margaretmary Dys, MD [facility]  Radiation Oncology Direct Dial: [phone]  Fax: [phone] [email or web address]  Skype  LinkedIn

## 2022-10-31 NOTE — Progress Notes (Signed)
Patient was a RadOnc Consult on 09/17/22 for his PSMA-avid periaortic lymph node s/p RALP 05/2012 and EBRT 09/2018 for Stage pT2bN1, Gleason 3+3 prostate cancer with rising PSA of 0.78.  Patient proceed with treatment recommendations of 10 fractions of Ultrahypofractionated Radiotherapy [facility]) treating the PET avid node and had his final radiation treatment on 10/18/22.   Request placed for patient to be scheduled for a one month post treatment call, and patient will follow up with urology on 11/04/22.

## 2022-12-13 ENCOUNTER — Other Ambulatory Visit: Payer: Self-pay | Admitting: Urology

## 2022-12-13 DIAGNOSIS — C61 Malignant neoplasm of prostate: Secondary | ICD-10-CM

## 2022-12-17 ENCOUNTER — Ambulatory Visit: Payer: Medicare Other | Admitting: Cardiovascular Disease

## 2022-12-17 ENCOUNTER — Ambulatory Visit
Admission: RE | Admit: 2022-12-17 | Discharge: 2022-12-17 | Disposition: A | Discharge status: Home / Self Care | Payer: Medicare Other | Source: Ambulatory Visit | Attending: Radiation Oncology | Admitting: Radiation Oncology

## 2022-12-17 ENCOUNTER — Encounter: Payer: Self-pay | Admitting: Cardiovascular Disease

## 2022-12-17 VITALS — BP 110/62 | HR 69 | Ht 71.0 in | Wt 194.4 lb

## 2022-12-17 DIAGNOSIS — Z7984 Long term (current) use of oral hypoglycemic drugs: Secondary | ICD-10-CM

## 2022-12-17 DIAGNOSIS — E1169 Type 2 diabetes mellitus with other specified complication: Secondary | ICD-10-CM | POA: Diagnosis not present

## 2022-12-17 DIAGNOSIS — Z136 Encounter for screening for cardiovascular disorders: Secondary | ICD-10-CM | POA: Insufficient documentation

## 2022-12-17 DIAGNOSIS — I10 Essential (primary) hypertension: Secondary | ICD-10-CM | POA: Diagnosis not present

## 2022-12-17 DIAGNOSIS — E781 Pure hyperglyceridemia: Secondary | ICD-10-CM | POA: Insufficient documentation

## 2022-12-17 DIAGNOSIS — E785 Hyperlipidemia, unspecified: Secondary | ICD-10-CM | POA: Insufficient documentation

## 2022-12-17 NOTE — Patient Instructions (Signed)

## 2022-12-17 NOTE — Progress Notes (Signed)
  Radiation Oncology         [phone] ________________________________  Name: Jonathan Washington MRN: [id number]  Date of Service: 12/17/2022  DOB: April 13, 1956  Post Treatment Telephone Note  Diagnosis:  67 y.o. gentleman with a PSMA-avid periaortic lymph node s/p RALP 05/2012 and EBRT 09/2018 for Stage pT2bN1, Gleason 3+3 prostate cancer with rising PSA of 0.78   (as documented in provider EOT note)   Pre Treatment IPSS Score: 10 (as documented in the provider consult note)  The patient was available for call today.   Symptoms of fatigue have improved since completing therapy.  Symptoms of bladder changes have improved since completing therapy. Current symptoms include polyuria, and medications for bladder symptoms include Tamsulosin.  Symptoms of bowel changes have improved since completing therapy. Current symptoms include none, and medications for bowel symptoms include none.   Post Treatment IPSS Score: IPSS Questionnaire (AUA-7): Over the past month.   1)  How often have you had a sensation of not emptying your bladder completely after you finish urinating?  0 - Not at all  2)  How often have you had to urinate again less than two hours after you finished urinating? 4 - More than half the time  3)  How often have you found you stopped and started again several times when you urinated?  1 - Less than 1 time in 5  4) How difficult have you found it to postpone urination?  1 - Less than 1 time in 5  5) How often have you had a weak urinary stream?  4 - More than half the time  6) How often have you had to push or strain to begin urination?  0 - Not at all  7) How many times did you most typically get up to urinate from the time you went to bed until the time you got up in the morning?  2 - 2 times  Total score:  12. Which indicates moderate symptoms  0-7 mildly symptomatic   8-19 moderately symptomatic   20-35 severely symptomatic   Patient has a scheduled follow up visit  with his urologist, Dr. Marlou Porch, on 02/10/2023 for ongoing surveillance. He was counseled that PSA levels will be drawn in the urology office, and was reassured that additional time is expected to improve bowel and bladder symptoms. He was encouraged to call back with concerns or questions regarding radiation.   This concludes the interaction.  Ruel Favors, LPN

## 2022-12-17 NOTE — Progress Notes (Signed)
12/17/2022 RICE BINZ   07-21-1955  [id number]  Primary Physician Junie Spencer, FNP Primary Cardiologist: Runell Gess MD Nicholes Calamity, [place]  HPI:  Jonathan Washington is a 67 y.o. thin-appearing married Caucasian male, father of 2 sons, grandfather of 6 grandchildren who is retired from being a Armed forces operational officer at [employer].  He was referred by Dennie Maizes, FNP because of cardiac risk factors and hypertriglyceridemia.  He smoked remotely in the 80s.  He drinks socially.  He does have treated hypertension and hyperlipidemia as well as type 2 diabetes.  There is no family history for heart disease.  Never had a heart attack or stroke.  He denies chest pain or shortness of breath.  He is fairly active, walks several times a day, gardens and goes to an adult exercise class twice a week.  He has had prostate cancer in the past.  He has been on rosuvastatin for hyper lipidemia and fenofibrate as well as Vascepa which she did not tolerate.  His most recent lipid profile performed by his PCP 10/07/2022 revealed total cholesterol of 113, LDL 37, HDL 19 with a triglyceride level of 391.   Current Meds  Medication Sig   amLODipine (NORVASC) 2.5 MG tablet Take 1 tablet by mouth once daily   aspirin 81 MG tablet Take 81 mg by mouth.   Continuous Blood Gluc Sensor (FREESTYLE LIBRE 2 SENSOR) MISC USE AS DIRECTED EVERY  14  DAYS   fenofibrate 160 MG tablet Take 1 tablet by mouth once daily   ibuprofen (ADVIL) 200 MG tablet Take 200 mg by mouth as needed for moderate pain.   imipramine (TOFRANIL) 10 MG tablet TAKE 1 TABLET BY MOUTH AT BEDTIME   JARDIANCE 10 MG TABS tablet Take 10 mg by mouth daily.   losartan (COZAAR) 100 MG tablet Take 1 tablet by mouth once daily   MELATONIN PO Take by mouth as needed (sleep).   metFORMIN (GLUCOPHAGE-XR) 500 MG 24 hr tablet Take 500 mg by mouth 3 (three) times daily.   omeprazole (PRILOSEC) 20 MG capsule Take 20 mg by mouth daily as  needed. For heartburn   ondansetron (ZOFRAN) 8 MG tablet Take 1 tablet (8 mg total) by mouth every 8 (eight) hours as needed for nausea or vomiting.   rosuvastatin (CRESTOR) 5 MG tablet Take 1 tablet by mouth once daily   sildenafil (VIAGRA) 100 MG tablet Take 100 mg by mouth daily as needed for erectile dysfunction.   tamsulosin (FLOMAX) 0.4 MG CAPS capsule Take 0.4 mg by mouth daily.   timolol (TIMOPTIC) 0.5 % ophthalmic solution Place 1 drop into the right eye 2 (two) times daily.     Allergies  Allergen Reactions   Niacin Rash    Social History   Socioeconomic History   Marital status: Married    Spouse name: Not on file   Number of children: 2   Years of education: Not on file   Highest education level: Not on file  Occupational History    Comment: retired since 2015  Tobacco Use   Smoking status: Former    Current packs/day: 0.00    Average packs/day: 2.0 packs/day for 15.0 years (30.0 ttl pk-yrs)    Types: Cigarettes    Start date: 04/23/1963    Quit date: 04/22/1978    Years since quitting: 44.6   Smokeless tobacco: Never   Tobacco comments:    QUIT SMOKING 1980  Vaping Use   Vaping  status: Never Used  Substance and Sexual Activity   Alcohol use: Yes    Alcohol/week: 2.0 standard drinks of alcohol    Types: 2 Cans of beer per week   Drug use: No   Sexual activity: Yes    Comment: with aid of Viagra  Other Topics Concern   Not on file  Social History Narrative   Not on file   Social Determinants of Health   Financial Resource Strain: Low Risk  (05/16/2022)   Overall Financial Resource Strain (CARDIA)    Difficulty of Paying Living Expenses: Not hard at all  Food Insecurity: No Food Insecurity (09/17/2022)   Hunger Vital Sign    Worried About Running Out of Food in the Last Year: Never true    Ran Out of Food in the Last Year: Never true  Transportation Needs: No Transportation Needs (09/17/2022)   PRAPARE - Administrator, Civil Service (Medical):  No    Lack of Transportation (Non-Medical): No  Physical Activity: Insufficiently Active (05/16/2022)   Exercise Vital Sign    Days of Exercise per Week: 2 days    Minutes of Exercise per Session: 60 min  Stress: No Stress Concern Present (05/16/2022)   [employer] of Occupational Health - Occupational Stress Questionnaire    Feeling of Stress : Not at all  Social Connections: Socially Integrated (05/16/2022)   Social Connection and Isolation Panel [NHANES]    Frequency of Communication with Friends and Family: More than three times a week    Frequency of Social Gatherings with Friends and Family: More than three times a week    Attends Religious Services: More than 4 times per year    Active Member of [employer] or Organizations: Yes    Attends Engineer, structural: More than 4 times per year    Marital Status: Married  Catering manager Violence: Not At Risk (09/17/2022)   Humiliation, Afraid, Rape, and Kick questionnaire    Fear of Current or Ex-Partner: No    Emotionally Abused: No    Physically Abused: No    Sexually Abused: No     Review of Systems: General: negative for chills, fever, night sweats or weight changes.  Cardiovascular: negative for chest pain, dyspnea on exertion, edema, orthopnea, palpitations, paroxysmal nocturnal dyspnea or shortness of breath Dermatological: negative for rash Respiratory: negative for cough or wheezing Urologic: negative for hematuria Abdominal: negative for nausea, vomiting, diarrhea, bright red blood per rectum, melena, or hematemesis Neurologic: negative for visual changes, syncope, or dizziness All other systems reviewed and are otherwise negative except as noted above.    Blood pressure 110/62, pulse 69, height 5\' 11"  (1.803 m), weight 194 lb 6.4 oz (88.2 kg), SpO2 93%.  General appearance: alert and no distress Neck: no adenopathy, no carotid bruit, no JVD, supple, symmetrical, trachea midline, and thyroid not enlarged,  symmetric, no tenderness/mass/nodules Lungs: clear to auscultation bilaterally Heart: regular rate and rhythm, S1, S2 normal, no murmur, click, rub or gallop Extremities: extremities normal, atraumatic, no cyanosis or edema Pulses: 2+ and symmetric Skin: Skin color, texture, turgor normal. No rashes or lesions Neurologic: Grossly normal  EKG sinus rhythm at 69 without ST or T wave changes.  Personally reviewed this EKG.      ASSESSMENT AND PLAN:   Hyperlipidemia associated with type 2 diabetes mellitus (HCC) Patient on rosuvastatin.  He is also on fenofibrate and was on Vascepa which he has stopped.  His most recent lipid profile performed by his  PCP 10/07/2022 revealed total cholesterol 113, LDL 37, HDL of 19 and triglyceride level of 391.  I am referring him to Dr. Blanchie Dessert lipid clinic for further evaluation and treatment options.  Essential hypertension History of essential hypertension blood pressure measured today at 110/62.  He is on low-dose amlodipine and losartan.     Runell Gess MD FACP,FACC,FAHA, [facility] 12/17/2022 11:25 AM

## 2022-12-17 NOTE — Assessment & Plan Note (Signed)
Patient on rosuvastatin.  He is also on fenofibrate and was on Vascepa which he has stopped.  His most recent lipid profile performed by his PCP 10/07/2022 revealed total cholesterol 113, LDL 37, HDL of 19 and triglyceride level of 391.  I am referring him to Dr. Blanchie Dessert lipid clinic for further evaluation and treatment options.

## 2022-12-17 NOTE — Assessment & Plan Note (Signed)
History of essential hypertension blood pressure measured today at 110/62.  He is on low-dose amlodipine and losartan.

## 2022-12-26 ENCOUNTER — Encounter: Payer: Self-pay | Admitting: *Deleted

## 2023-01-16 ENCOUNTER — Encounter: Payer: Self-pay | Admitting: *Deleted

## 2023-01-16 ENCOUNTER — Inpatient Hospital Stay: Payer: Medicare Other | Attending: Adult Health | Admitting: *Deleted

## 2023-01-16 DIAGNOSIS — C775 Secondary and unspecified malignant neoplasm of intrapelvic lymph nodes: Secondary | ICD-10-CM

## 2023-01-16 DIAGNOSIS — C61 Malignant neoplasm of prostate: Secondary | ICD-10-CM

## 2023-01-16 NOTE — Progress Notes (Signed)
SCP reviewed and completed. Pt will have 3 mos PSA labs October 14th.

## 2023-02-10 ENCOUNTER — Other Ambulatory Visit: Payer: Self-pay | Admitting: Family

## 2023-02-19 ENCOUNTER — Other Ambulatory Visit: Payer: Self-pay | Admitting: Family

## 2023-02-20 ENCOUNTER — Ambulatory Visit: Payer: Medicare Other | Attending: Internal Medicine | Admitting: Internal Medicine

## 2023-02-20 ENCOUNTER — Encounter: Payer: Self-pay | Admitting: Internal Medicine

## 2023-02-20 ENCOUNTER — Other Ambulatory Visit: Payer: Self-pay | Admitting: Family

## 2023-02-20 VITALS — BP 120/64 | HR 85 | Ht 71.0 in | Wt 191.0 lb

## 2023-02-20 DIAGNOSIS — E785 Hyperlipidemia, unspecified: Secondary | ICD-10-CM | POA: Insufficient documentation

## 2023-02-20 DIAGNOSIS — I10 Essential (primary) hypertension: Secondary | ICD-10-CM | POA: Insufficient documentation

## 2023-02-20 DIAGNOSIS — I700 Atherosclerosis of aorta: Secondary | ICD-10-CM | POA: Insufficient documentation

## 2023-02-20 DIAGNOSIS — E1169 Type 2 diabetes mellitus with other specified complication: Secondary | ICD-10-CM | POA: Diagnosis present

## 2023-02-20 DIAGNOSIS — E781 Pure hyperglyceridemia: Secondary | ICD-10-CM | POA: Diagnosis present

## 2023-02-20 DIAGNOSIS — I2510 Atherosclerotic heart disease of native coronary artery without angina pectoris: Secondary | ICD-10-CM | POA: Insufficient documentation

## 2023-02-20 NOTE — Patient Instructions (Addendum)
Medication Instructions:  Icosapent Ethyl (Vascepa) 1 gram capsules -- dose is 2 capsules twice daily  Co-Pay assistance available thru the [employer]. This is donor-funded and fund are available at various times. If you decide you want to re-try Vascepa, please let us know.   *If you need a refill on your cardiac medications before your next appointment, please call your pharmacy*    Follow-Up: At [facility], you and your health needs are our priority.  As part of our continuing mission to provide you with exceptional heart care, we have created designated Provider Care Teams.  These Care Teams include your primary Cardiologist (physician) and Advanced Practice Providers (APPs -  Physician Assistants and Nurse Practitioners) who all work together to provide you with the care you need, when you need it.  We recommend signing up for the patient portal called "MyChart".  Sign up information is provided on this After Visit Summary.  MyChart is used to connect with patients for Virtual Visits (Telemedicine).  Patients are able to view lab/test results, encounter notes, upcoming appointments, etc.  Non-urgent messages can be sent to your provider as well.   To learn more about what you can do with MyChart, go to [email or web address].    Your next appointment:   PRN with Dr. Rennis Golden for lipid clinic

## 2023-02-21 NOTE — Progress Notes (Signed)
LIPID CLINIC CONSULT NOTE  Chief Complaint:  Manage dyslipidemia  Primary Care Physician: Junie Spencer, FNP  Primary Cardiologist:  None  HPI:  Jonathan Washington is a 67 y.o. male who is being seen today for the evaluation of dyslipidemia at the request of Nanetta Batty, MD. This is a 67 yo male sent for evaluation and management of dyslipidemia by my partner Dr. Allyson Sabal. He was noted to have recent lipid testing by his primary care provider which showed total cholesterol 113, triglycerides 327, HDL 14 and LDL 34.  Cardiovascular disease.  He underwent CT of the abdomen pelvis back in 2023 and this demonstrated calcification in the right coronary artery.  He also has aortic atherosclerosis.  He has been on therapy including 5 mg rosuvastatin and fenofibrate 160 mg daily.  He also has diabetes and hypertension.  Due to his elevated triglycerides he was referred for further recommendations.  He reports he tries to watch carbohydrates in his diet and to some extent saturated fats.  He is not a heavy alcohol user.  PMHx:  Past Medical History:  Diagnosis Date   Cataract    Dyslipidemia    Fatty liver    Fibromyalgia    GERD (gastroesophageal reflux disease)    Glaucoma    RIGHT EYE   Glucose intolerance (impaired glucose tolerance)    Hypertension    Pneumonia    4 YRS AGO   Prostate cancer (HCC)    Vertigo    ESPECIALLY IF PT TURNS HIS HEAD OR MOVES A CERTAIN WAY    Past Surgical History:  Procedure Laterality Date   CHOLECYSTECTOMY     EYE SURGERY Bilateral    cataracts   LEFT KNEE ARTHROSCOPY     LYMPHADENECTOMY  05/27/2012   Procedure: LYMPHADENECTOMY;  Surgeon: Valetta Fuller, MD;  Location: [facility];  Service: Urology;  Laterality: Bilateral;   ROBOT ASSISTED LAPAROSCOPIC RADICAL PROSTATECTOMY  05/27/2012   Procedure: ROBOTIC ASSISTED LAPAROSCOPIC RADICAL PROSTATECTOMY;  Surgeon: Valetta Fuller, MD;  Location: [facility];  Service: Urology;  Laterality: N/A;   SURGERY FOR  DEVIATED NASAL SEPTUM      FAMHx:  Family History  Problem Relation Age of Onset   Breast cancer Mother    Bladder Cancer Father    Throat cancer Father    Lung cancer Paternal Aunt        non smoker    SOCHx:   reports that he quit smoking about 44 years ago. His smoking use included cigarettes. He started smoking about 59 years ago. He has a 30 pack-year smoking history. He has never used smokeless tobacco. He reports current alcohol use of about 2.0 standard drinks of alcohol per week. He reports that he does not use drugs.  ALLERGIES:  Allergies  Allergen Reactions   Niacin Rash    ROS: Pertinent items noted in HPI and remainder of comprehensive ROS otherwise negative.  HOME MEDS: Current Outpatient Medications on File Prior to Visit  Medication Sig Dispense Refill   amLODipine (NORVASC) 2.5 MG tablet Take 1 tablet by mouth once daily 90 tablet 0   aspirin 81 MG tablet Take 81 mg by mouth.     Continuous Blood Gluc Sensor (FREESTYLE LIBRE 2 SENSOR) MISC USE AS DIRECTED EVERY  14  DAYS     fenofibrate 160 MG tablet Take 1 tablet by mouth once daily 90 tablet 0   ibuprofen (ADVIL) 200 MG tablet Take 200 mg by mouth as needed for  moderate pain.     imipramine (TOFRANIL) 10 MG tablet TAKE 1 TABLET BY MOUTH AT BEDTIME 90 tablet 1   JARDIANCE 10 MG TABS tablet Take 10 mg by mouth daily.     losartan (COZAAR) 100 MG tablet Take 1 tablet by mouth once daily 90 tablet 1   MELATONIN PO Take by mouth as needed (sleep).     metFORMIN (GLUCOPHAGE-XR) 500 MG 24 hr tablet Take 500 mg by mouth 3 (three) times daily.     omeprazole (PRILOSEC) 20 MG capsule Take 20 mg by mouth daily as needed. For heartburn     rosuvastatin (CRESTOR) 5 MG tablet Take 1 tablet by mouth once daily 90 tablet 1   sildenafil (VIAGRA) 100 MG tablet Take 100 mg by mouth daily as needed for erectile dysfunction.     tamsulosin (FLOMAX) 0.4 MG CAPS capsule Take 0.4 mg by mouth daily.     timolol (TIMOPTIC) 0.5 %  ophthalmic solution Place 1 drop into the right eye 2 (two) times daily.     ondansetron (ZOFRAN) 8 MG tablet Take 1 tablet (8 mg total) by mouth every 8 (eight) hours as needed for nausea or vomiting. (Patient not taking: Reported on 01/16/2023) 20 tablet 0   No current facility-administered medications on file prior to visit.    LABS/IMAGING: No results found for this or any previous visit (from the past 48 hour(s)). No results found.  LIPID PANEL:    Component Value Date/Time   CHOL 96 (L) 01/30/2021 0834   TRIG 178 (H) 01/30/2021 0834   HDL 21 (L) 01/30/2021 0834   CHOLHDL 4.6 01/30/2021 0834   LDLCALC 45 01/30/2021 0834    WEIGHTS: Wt Readings from Last 3 Encounters:  02/20/23 191 lb (86.6 kg)  12/17/22 194 lb 6.4 oz (88.2 kg)  09/17/22 194 lb 9.6 oz (88.3 kg)    VITALS: BP 120/64 (BP Location: Right Arm, Patient Position: Sitting, Cuff Size: Large)   Pulse 85   Ht 5\' 11"  (1.803 m)   Wt 191 lb (86.6 kg)   SpO2 93%   BMI 26.64 kg/m   EXAM: Deferred  EKG: Deferred  ASSESSMENT: Mixed dyslipidemia with high triglycerides Coronary artery calcification Aortic atherosclerosis Essential hypertension  PLAN: 1.   Mr. Reason is noted to have a mixed dyslipidemia with high triglycerides and has previously had coronary artery calcification and aortic atherosclerosis.  His target LDL is less than 70 and he may benefit from therapies to lower elevated triglycerides.  1 such therapy would be Vascepa 2 g twice daily.  Will reach out for prior authorization for this.  He may also be a candidate for an upcoming clinical trial.  I told him that we would reach out if that were available.  Chrystie Nose, MD, [facility], FACP  [place]  [facility]  Medical Director of the Advanced Lipid Disorders &  Cardiovascular Risk Reduction Clinic Diplomate of the American Board of Clinical Lipidology Attending Cardiologist  Direct Dial: [phone]  Fax: [phone]  Website:   [email or web address] Nicely Hansika Leaming 02/21/2023, 12:40 PM

## 2023-02-27 ENCOUNTER — Telehealth: Payer: Self-pay | Admitting: Family

## 2023-02-27 ENCOUNTER — Ambulatory Visit (INDEPENDENT_AMBULATORY_CARE_PROVIDER_SITE_OTHER): Payer: Medicare Other | Admitting: Family

## 2023-02-27 ENCOUNTER — Encounter: Payer: Self-pay | Admitting: Family

## 2023-02-27 VITALS — BP 113/73 | HR 70 | Temp 97.1°F | Ht 71.0 in | Wt 188.2 lb

## 2023-02-27 DIAGNOSIS — M5442 Lumbago with sciatica, left side: Secondary | ICD-10-CM | POA: Diagnosis not present

## 2023-02-27 DIAGNOSIS — E1169 Type 2 diabetes mellitus with other specified complication: Secondary | ICD-10-CM

## 2023-02-27 DIAGNOSIS — I10 Essential (primary) hypertension: Secondary | ICD-10-CM | POA: Diagnosis not present

## 2023-02-27 DIAGNOSIS — E785 Hyperlipidemia, unspecified: Secondary | ICD-10-CM

## 2023-02-27 DIAGNOSIS — G8929 Other chronic pain: Secondary | ICD-10-CM

## 2023-02-27 DIAGNOSIS — Z794 Long term (current) use of insulin: Secondary | ICD-10-CM

## 2023-02-27 DIAGNOSIS — K219 Gastro-esophageal reflux disease without esophagitis: Secondary | ICD-10-CM

## 2023-02-27 DIAGNOSIS — C775 Secondary and unspecified malignant neoplasm of intrapelvic lymph nodes: Secondary | ICD-10-CM

## 2023-02-27 DIAGNOSIS — C61 Malignant neoplasm of prostate: Secondary | ICD-10-CM | POA: Diagnosis not present

## 2023-02-27 DIAGNOSIS — Z23 Encounter for immunization: Secondary | ICD-10-CM

## 2023-02-27 LAB — BAYER DCA HB A1C WAIVED: HB A1C (BAYER DCA - WAIVED): 5.9 % — ABNORMAL HIGH (ref 4.8–5.6)

## 2023-02-27 MED ORDER — AMLODIPINE BESYLATE 2.5 MG PO TABS
ORAL_TABLET | ORAL | 1 refills | Status: DC
Start: 2023-02-27 — End: 2023-09-16

## 2023-02-27 MED ORDER — FENOFIBRATE 160 MG PO TABS
160.0000 mg | ORAL_TABLET | Freq: Every day | ORAL | 1 refills | Status: DC
Start: 2023-02-27 — End: 2023-09-16

## 2023-02-27 NOTE — Telephone Encounter (Signed)
This is fixed.   Jannifer Rodney, FNP

## 2023-02-27 NOTE — Progress Notes (Signed)
Subjective:    Patient ID: Jonathan Washington, male    DOB: March 28, 1956, 67 y.o.   MRN: [id number]  Chief Complaint  Patient presents with   Medical Management of Chronic Issues   Pt presents to the office today to chronic follow up. He is followed by Urologists every 3 months for recurrent Prostate Cancer.   His  PET scan from 09/02/22 showed, "1. Single radiotracer avid periaortic lymph node consistent with prostate cancer nodal metastasis. 2. No evidence of local recurrence in the prostatectomy bed. No pelvic lymphadenopathy. 3. No evidence of visceral metastasis or skeletal metastasis. 4. Small RIGHT middle lobe pulmonary nodule. Favor unrelated to prostate cancer. No follow-up recommended unless risk factors for lung cancer."  He completed radiation and his PSA trending downwards.     He is followed by Endo every 6 months for DM. His last A1C was 7.1 . He states he does exercising class twice a week for 45 mins and walking after every meal every day.    His Triglycerides were elevated to 327. He is taking fenofibrate 160 mg daily Hypertension This is a chronic problem. The current episode started more than 1 year ago. The problem has been resolved since onset. The problem is controlled. Associated symptoms include malaise/fatigue. Pertinent negatives include no blurred vision, peripheral edema or shortness of breath. Risk factors for coronary artery disease include diabetes mellitus, dyslipidemia and male gender. The current treatment provides moderate improvement.  Gastroesophageal Reflux He complains of belching and heartburn. This is a chronic problem. The current episode started more than 1 year ago. He has tried a PPI for the symptoms. The treatment provided moderate relief.  Hyperlipidemia This is a chronic problem. The current episode started more than 1 year ago. The problem is controlled. Recent lipid tests were reviewed and are normal. Exacerbating diseases include  obesity. Pertinent negatives include no shortness of breath. Current antihyperlipidemic treatment includes statins. The current treatment provides moderate improvement of lipids. Risk factors for coronary artery disease include dyslipidemia, hypertension, a sedentary lifestyle and post-menopausal.  Diabetes He presents for his follow-up diabetic visit. He has type 2 diabetes mellitus. Associated symptoms include foot paresthesias. Pertinent negatives for diabetes include no blurred vision. Symptoms are stable. Diabetic complications include peripheral neuropathy. Risk factors for coronary artery disease include dyslipidemia, diabetes mellitus, hypertension, male sex and sedentary lifestyle. He is following a diabetic and generally healthy diet. His overall blood glucose range is 130-140 mg/dl. Eye exam is current.  Back Pain This is a chronic problem. The current episode started more than 1 year ago. The problem occurs intermittently. The problem has been waxing and waning since onset. The pain is present in the lumbar spine. The quality of the pain is described as aching. The pain is at a severity of 0/10. The patient is experiencing no pain.      Review of Systems  Constitutional:  Positive for malaise/fatigue.  Eyes:  Negative for blurred vision.  Respiratory:  Negative for shortness of breath.   Gastrointestinal:  Positive for heartburn.  Musculoskeletal:  Positive for back pain.  All other systems reviewed and are negative.      Objective:   Physical Exam Vitals reviewed.  Constitutional:      General: He is not in acute distress.    Appearance: He is well-developed.  HENT:     Head: Normocephalic.     Right Ear: Tympanic membrane normal.     Left Ear: Tympanic membrane normal.  Eyes:  General:        Right eye: No discharge.        Left eye: No discharge.     Pupils: Pupils are equal, round, and reactive to light.  Neck:     Thyroid: No thyromegaly.  Cardiovascular:      Rate and Rhythm: Normal rate and regular rhythm.     Heart sounds: Normal heart sounds. No murmur heard. Pulmonary:     Effort: Pulmonary effort is normal. No respiratory distress.     Breath sounds: Normal breath sounds. No wheezing.  Abdominal:     General: Bowel sounds are normal. There is no distension.     Palpations: Abdomen is soft.     Tenderness: There is no abdominal tenderness.  Musculoskeletal:        General: No tenderness. Normal range of motion.     Cervical back: Normal range of motion and neck supple.  Skin:    General: Skin is warm and dry.     Findings: No erythema or rash.  Neurological:     Mental Status: He is alert and oriented to person, place, and time.     Cranial Nerves: No cranial nerve deficit.     Deep Tendon Reflexes: Reflexes are normal and symmetric.  Psychiatric:        Behavior: Behavior normal.        Thought Content: Thought content normal.        Judgment: Judgment normal.       BP 113/73   Pulse 70   Temp (!) 97.1 F (36.2 C) (Temporal)   Ht 5\' 11"  (1.803 m)   Wt 188 lb 3.2 oz (85.4 kg)   SpO2 93%   BMI 26.25 kg/m      Assessment & Plan:  Jonathan Washington comes in today with chief complaint of Medical Management of Chronic Issues   Diagnosis and orders addressed:  1. Recurrent adenocarcinoma of prostate (HCC) - CMP14+EGFR - CBC with Differential/Platelet  2. Type 2 diabetes mellitus with other specified complication, with long-term current use of insulin (HCC) - Bayer DCA Hb A1c Waived - Microalbumin / creatinine urine ratio - CMP14+EGFR - CBC with Differential/Platelet  3. Essential hypertension - amLODipine (NORVASC) 2.5 MG tablet; Take 1 tablet by mouth once daily  Dispense: 90 tablet; Refill: 1 - CMP14+EGFR - CBC with Differential/Platelet  4. Malignant neoplasm of prostate metastatic to intrapelvic lymph node (HCC) - CMP14+EGFR - CBC with Differential/Platelet  5. Chronic left-sided low back pain with  left-sided sciatica - CMP14+EGFR - CBC with Differential/Platelet  6. Hyperlipidemia associated with type 2 diabetes mellitus (HCC) - fenofibrate 160 MG tablet; Take 1 tablet (160 mg total) by mouth daily.  Dispense: 90 tablet; Refill: 1 - CMP14+EGFR - CBC with Differential/Platelet - Lipid panel  7. Gastroesophageal reflux disease, unspecified whether esophagitis present - CMP14+EGFR - CBC with Differential/Platelet  8. Need for Tdap vaccination - Tdap vaccine greater than or equal to 7yo IM   Labs pending Continue current medications and keep specialists appointment  Health Maintenance reviewed Diet and exercise encouraged  Follow up plan: 6 months   Jannifer Rodney, FNP

## 2023-02-27 NOTE — Patient Instructions (Signed)
Health Maintenance After Age 69 After age 69, you are at a higher risk for certain long-term diseases and infections as well as injuries from falls. Falls are a major cause of broken bones and head injuries in people who are older than age 69. Getting regular preventive care can help to keep you healthy and well. Preventive care includes getting regular testing and making lifestyle changes as recommended by your health care provider. Talk with your health care provider about: Which screenings and tests you should have. A screening is a test that checks for a disease when you have no symptoms. A diet and exercise plan that is right for you. What should I know about screenings and tests to prevent falls? Screening and testing are the best ways to find a health problem early. Early diagnosis and treatment give you the best chance of managing medical conditions that are common after age 69. Certain conditions and lifestyle choices may make you more likely to have a fall. Your health care provider may recommend: Regular vision checks. Poor vision and conditions such as cataracts can make you more likely to have a fall. If you wear glasses, make sure to get your prescription updated if your vision changes. Medicine review. Work with your health care provider to regularly review all of the medicines you are taking, including over-the-counter medicines. Ask your health care provider about any side effects that may make you more likely to have a fall. Tell your health care provider if any medicines that you take make you feel dizzy or sleepy. Strength and balance checks. Your health care provider may recommend certain tests to check your strength and balance while standing, walking, or changing positions. Foot health exam. Foot pain and numbness, as well as not wearing proper footwear, can make you more likely to have a fall. Screenings, including: Osteoporosis screening. Osteoporosis is a condition that causes  the bones to get weaker and break more easily. Blood pressure screening. Blood pressure changes and medicines to control blood pressure can make you feel dizzy. Depression screening. You may be more likely to have a fall if you have a fear of falling, feel depressed, or feel unable to do activities that you used to do. Alcohol use screening. Using too much alcohol can affect your balance and may make you more likely to have a fall. Follow these instructions at home: Lifestyle Do not drink alcohol if: Your health care provider tells you not to drink. If you drink alcohol: Limit how much you have to: 0-1 drink a day for women. 0-2 drinks a day for men. Know how much alcohol is in your drink. In the [place] one drink equals one 12 oz bottle of beer (355 mL), one 5 oz glass of wine (148 mL), or one 1 oz glass of hard liquor (44 mL). Do not use any products that contain nicotine or tobacco. These products include cigarettes, chewing tobacco, and vaping devices, such as e-cigarettes. If you need help quitting, ask your health care provider. Activity  Follow a regular exercise program to stay fit. This will help you maintain your balance. Ask your health care provider what types of exercise are appropriate for you. If you need a cane or walker, use it as recommended by your health care provider. Wear supportive shoes that have nonskid soles. Safety  Remove any tripping hazards, such as rugs, cords, and clutter. Install safety equipment such as grab bars in bathrooms and safety rails on stairs. Keep rooms and walkways   well-lit. General instructions Talk with your health care provider about your risks for falling. Tell your health care provider if: You fall. Be sure to tell your health care provider about all falls, even ones that seem minor. You feel dizzy, tiredness (fatigue), or off-balance. Take over-the-counter and prescription medicines only as told by your health care provider. These include  supplements. Eat a healthy diet and maintain a healthy weight. A healthy diet includes low-fat dairy products, low-fat (lean) meats, and fiber from whole grains, beans, and lots of fruits and vegetables. Stay current with your vaccines. Schedule regular health, dental, and eye exams. Summary Having a healthy lifestyle and getting preventive care can help to protect your health and wellness after age 69. Screening and testing are the best way to find a health problem early and help you avoid having a fall. Early diagnosis and treatment give you the best chance for managing medical conditions that are more common for people who are older than age 69. Falls are a major cause of broken bones and head injuries in people who are older than age 69. Take precautions to prevent a fall at home. Work with your health care provider to learn what changes you can make to improve your health and wellness and to prevent falls. This information is not intended to replace advice given to you by your health care provider. Make sure you discuss any questions you have with your health care provider. Document Revised: 08/28/2020 Document Reviewed: 08/28/2020 Elsevier Patient Education  2024 Elsevier Inc.  

## 2023-02-27 NOTE — Telephone Encounter (Signed)
Patients wife aware it has been removed.

## 2023-02-28 LAB — CBC WITH DIFFERENTIAL/PLATELET
Basophils Absolute: 0.1 10*3/uL (ref 0.0–0.2)
Basos: 2 %
EOS (ABSOLUTE): 0.4 10*3/uL (ref 0.0–0.4)
Eos: 8 %
Hematocrit: 51.2 % — ABNORMAL HIGH (ref 37.5–51.0)
Hemoglobin: 17.1 g/dL (ref 13.0–17.7)
Immature Grans (Abs): 0 10*3/uL (ref 0.0–0.1)
Immature Granulocytes: 0 %
Lymphocytes Absolute: 0.8 10*3/uL (ref 0.7–3.1)
Lymphs: 18 %
MCH: 31.4 pg (ref 26.6–33.0)
MCHC: 33.4 g/dL (ref 31.5–35.7)
MCV: 94 fL (ref 79–97)
Monocytes Absolute: 0.5 10*3/uL (ref 0.1–0.9)
Monocytes: 12 %
Neutrophils Absolute: 2.5 10*3/uL (ref 1.4–7.0)
Neutrophils: 60 %
Platelets: 242 10*3/uL (ref 150–450)
RBC: 5.45 x10E6/uL (ref 4.14–5.80)
RDW: 12.8 % (ref 11.6–15.4)
WBC: 4.2 10*3/uL (ref 3.4–10.8)

## 2023-02-28 LAB — CMP14+EGFR
ALT: 34 [IU]/L (ref 0–44)
AST: 32 [IU]/L (ref 0–40)
Albumin: 4.5 g/dL (ref 3.9–4.9)
Alkaline Phosphatase: 96 [IU]/L (ref 44–121)
BUN/Creatinine Ratio: 22 (ref 10–24)
BUN: 20 mg/dL (ref 8–27)
Bilirubin Total: 0.5 mg/dL (ref 0.0–1.2)
CO2: 23 mmol/L (ref 20–29)
Calcium: 9.5 mg/dL (ref 8.6–10.2)
Chloride: 102 mmol/L (ref 96–106)
Creatinine, Ser: 0.92 mg/dL (ref 0.76–1.27)
Globulin, Total: 2.3 g/dL (ref 1.5–4.5)
Glucose: 96 mg/dL (ref 70–99)
Potassium: 4 mmol/L (ref 3.5–5.2)
Sodium: 140 mmol/L (ref 134–144)
Total Protein: 6.8 g/dL (ref 6.0–8.5)
eGFR: 90 mL/min/{1.73_m2} (ref >59)

## 2023-02-28 LAB — LIPID PANEL
Chol/HDL Ratio: 4.3 ratio (ref 0.0–5.0)
Cholesterol, Total: 111 mg/dL (ref 100–199)
HDL: 26 mg/dL — ABNORMAL LOW (ref >39)
LDL Chol Calc (NIH): 49 mg/dL (ref 0–99)
Triglycerides: 223 mg/dL — ABNORMAL HIGH (ref 0–149)
VLDL Cholesterol Cal: 36 mg/dL (ref 5–40)

## 2023-02-28 LAB — MICROALBUMIN / CREATININE URINE RATIO
Creatinine, Urine: 24.3 mg/dL
Microalb/Creat Ratio: <12 mg/g{creat} (ref 0–29)
Microalbumin, Urine: <3 ug/mL

## 2023-04-01 ENCOUNTER — Other Ambulatory Visit: Payer: Self-pay | Admitting: Family

## 2023-04-08 ENCOUNTER — Other Ambulatory Visit: Payer: Self-pay | Admitting: Family

## 2023-05-23 ENCOUNTER — Ambulatory Visit (INDEPENDENT_AMBULATORY_CARE_PROVIDER_SITE_OTHER): Payer: Medicare Other

## 2023-05-23 VITALS — Ht 71.0 in | Wt 188.0 lb

## 2023-05-23 DIAGNOSIS — Z0000 Encounter for general adult medical examination without abnormal findings: Secondary | ICD-10-CM

## 2023-05-23 NOTE — Patient Instructions (Signed)
Mr. Baratta , Thank you for taking time to come for your Medicare Wellness Visit. I appreciate your ongoing commitment to your health goals. Please review the following plan we discussed and let me know if I can assist you in the future.   Referrals/Orders/Follow-Ups/Clinician Recommendations: Aim for 30 minutes of exercise or brisk walking, 6-8 glasses of water, and 5 servings of fruits and vegetables each day.  This is a list of the screening recommended for you and due dates:  Health Maintenance  Topic Date Due   COVID-19 Vaccine (4 - 2024-25 season) 12/22/2022   Eye exam for diabetics  05/30/2023   Yearly kidney function blood test for diabetes  02/27/2024   Yearly kidney health urinalysis for diabetes  02/27/2024   Complete foot exam   02/27/2024   Medicare Annual Wellness Visit  05/22/2024   Colon Cancer Screening  07/04/2024   DTaP/Tdap/Td vaccine (5 - Td or Tdap) 02/26/2033   Pneumonia Vaccine  Completed   Flu Shot  Completed   Hepatitis C Screening  Completed   Zoster (Shingles) Vaccine  Completed   HPV Vaccine  Aged Out   Hemoglobin A1C  Discontinued    Advanced directives: (ACP Link)Information on Advanced Care Planning can be found at [facility] of [place] Advance Health Care Directives Advance Health Care Directives ([email or web address]   Next Medicare Annual Wellness Visit scheduled for next year: Yes

## 2023-05-23 NOTE — Progress Notes (Signed)
Subjective:   Jonathan Washington is a 68 y.o. male who presents for Medicare Annual/Subsequent preventive examination.  Visit Complete: Virtual I connected with  [name] on 05/23/23 by a audio enabled telemedicine application and verified that I am speaking with the correct person using two identifiers.  Patient Location: Home  Provider Location: Home Office  I discussed the limitations of evaluation and management by telemedicine. The patient expressed understanding and agreed to proceed.  Vital Signs: Because this visit was a virtual/telehealth visit, some criteria may be missing or patient reported. Any vitals not documented were not able to be obtained and vitals that have been documented are patient reported.  Patient Medicare AWV questionnaire was completed by the patient on 05/19/23; I have confirmed that all information answered by patient is correct and no changes since this date.  Cardiac Risk Factors include: advanced age (>29men, >16 women);diabetes mellitus;dyslipidemia;hypertension;male gender     Objective:    Today's Vitals   05/23/23 1331  Weight: 188 lb (85.3 kg)  Height: 5\' 11"  (1.803 m)   Body mass index is 26.22 kg/m.     05/23/2023    1:34 PM 09/17/2022    9:42 AM 05/16/2022    8:43 AM 12/18/2021   12:26 PM 07/03/2021    9:40 PM 03/29/2021   10:23 AM 12/17/2018    8:35 AM  Advanced Directives  Does Patient Have a Medical Advance Directive? Yes Yes Yes Yes No No Yes  Type of Estate agent of [place];Living will Healthcare Power of [place];Living will Healthcare Power of [place];Living will    Living will  Does patient want to make changes to medical advance directive? No - Patient declined        Copy of Healthcare Power of Attorney in Chart? Yes - validated most recent copy scanned in chart (See row information)  No - copy requested      Would patient like information on creating a medical advance directive?     No -  Patient declined No - Patient declined     Current Medications (verified) Outpatient Encounter Medications as of 05/23/2023  Medication Sig   amLODipine (NORVASC) 2.5 MG tablet Take 1 tablet by mouth once daily   aspirin 81 MG tablet Take 81 mg by mouth.   Continuous Blood Gluc Sensor (FREESTYLE LIBRE 2 SENSOR) MISC USE AS DIRECTED EVERY  14  DAYS   fenofibrate 160 MG tablet Take 1 tablet (160 mg total) by mouth daily.   ibuprofen (ADVIL) 200 MG tablet Take 200 mg by mouth as needed for moderate pain.   imipramine (TOFRANIL) 10 MG tablet TAKE 1 TABLET BY MOUTH AT BEDTIME   JARDIANCE 10 MG TABS tablet Take 10 mg by mouth daily.   losartan (COZAAR) 100 MG tablet Take 1 tablet by mouth once daily   MELATONIN PO Take by mouth as needed (sleep).   metFORMIN (GLUCOPHAGE-XR) 500 MG 24 hr tablet Take 500 mg by mouth 3 (three) times daily.   omeprazole (PRILOSEC) 20 MG capsule Take 20 mg by mouth daily as needed. For heartburn   rosuvastatin (CRESTOR) 5 MG tablet Take 1 tablet by mouth once daily   sildenafil (VIAGRA) 100 MG tablet Take 100 mg by mouth daily as needed for erectile dysfunction.   tamsulosin (FLOMAX) 0.4 MG CAPS capsule Take 0.4 mg by mouth daily.   timolol (TIMOPTIC) 0.5 % ophthalmic solution Place 1 drop into the right eye 2 (two) times daily.   No facility-administered  encounter medications on file as of 05/23/2023.    Allergies (verified) Niacin   History: Past Medical History:  Diagnosis Date   Arthritis    Cataract    Dyslipidemia    Fatty liver    Fibromyalgia    GERD (gastroesophageal reflux disease)    Glaucoma    RIGHT EYE   Glucose intolerance (impaired glucose tolerance)    Hypertension    Pneumonia    4 YRS AGO   Prostate cancer (HCC)    Vertigo    ESPECIALLY IF PT TURNS HIS HEAD OR MOVES A CERTAIN WAY   Past Surgical History:  Procedure Laterality Date   CHOLECYSTECTOMY     EYE SURGERY Bilateral    cataracts   LEFT KNEE ARTHROSCOPY      LYMPHADENECTOMY  05/27/2012   Procedure: LYMPHADENECTOMY;  Surgeon: Valetta Fuller, MD;  Location: [facility];  Service: Urology;  Laterality: Bilateral;   ROBOT ASSISTED LAPAROSCOPIC RADICAL PROSTATECTOMY  05/27/2012   Procedure: ROBOTIC ASSISTED LAPAROSCOPIC RADICAL PROSTATECTOMY;  Surgeon: Valetta Fuller, MD;  Location: [facility];  Service: Urology;  Laterality: N/A;   SURGERY FOR DEVIATED NASAL SEPTUM     Family History  Problem Relation Age of Onset   Breast cancer Mother    Cancer Mother    Bladder Cancer Father    Throat cancer Father    Lung cancer Paternal Aunt        non smoker   Hypertension Maternal Grandmother    Social History   Socioeconomic History   Marital status: Married    Spouse name: Not on file   Number of children: 2   Years of education: Not on file   Highest education level: Not on file  Occupational History    Comment: retired since 2015  Tobacco Use   Smoking status: Former    Current packs/day: 0.00    Average packs/day: 2.0 packs/day for 15.0 years (30.0 ttl pk-yrs)    Types: Cigarettes, Cigars    Start date: 04/23/1963    Quit date: 04/22/1978    Years since quitting: 45.1   Smokeless tobacco: Never   Tobacco comments:    QUIT SMOKING 1980  Vaping Use   Vaping status: Never Used  Substance and Sexual Activity   Alcohol use: Yes    Alcohol/week: 2.0 standard drinks of alcohol    Types: 2 Cans of beer per week   Drug use: No   Sexual activity: Yes    Birth control/protection: None    Comment: with aid of Viagra  Other Topics Concern   Not on file  Social History Narrative   Not on file   Social Drivers of Health   Financial Resource Strain: Low Risk  (05/23/2023)   Overall Financial Resource Strain (CARDIA)    Difficulty of Paying Living Expenses: Not hard at all  Food Insecurity: No Food Insecurity (05/23/2023)   Hunger Vital Sign    Worried About Running Out of Food in the Last Year: Never true    Ran Out of Food in the Last Year: Never true   Transportation Needs: No Transportation Needs (05/23/2023)   PRAPARE - Administrator, Civil Service (Medical): No    Lack of Transportation (Non-Medical): No  Physical Activity: Insufficiently Active (05/23/2023)   Exercise Vital Sign    Days of Exercise per Week: 3 days    Minutes of Exercise per Session: 30 min  Stress: No Stress Concern Present (05/23/2023)   [employer] of  Occupational Health - Occupational Stress Questionnaire    Feeling of Stress : Not at all  Social Connections: Socially Integrated (05/23/2023)   Social Connection and Isolation Panel [NHANES]    Frequency of Communication with Friends and Family: More than three times a week    Frequency of Social Gatherings with Friends and Family: Three times a week    Attends Religious Services: More than 4 times per year    Active Member of Clubs or Organizations: Yes    Attends Engineer, structural: More than 4 times per year    Marital Status: Married    Tobacco Counseling Counseling given: Not Answered Tobacco comments: QUIT SMOKING 1980   Clinical Intake:  Pre-visit preparation completed: Yes  Pain : No/denies pain     Diabetes: Yes CBG done?: No Did pt. bring in CBG monitor from home?: No  How often do you need to have someone help you when you read instructions, pamphlets, or other written materials from your doctor or pharmacy?: 1 - Never  Interpreter Needed?: No  Information entered by :: Kandis Fantasia LPN   Activities of Daily Living    05/19/2023    8:41 AM  In your present state of health, do you have any difficulty performing the following activities:  Hearing? 0  Vision? 0  Difficulty concentrating or making decisions? 0  Walking or climbing stairs? 0  Dressing or bathing? 0  Doing errands, shopping? 0  Preparing Food and eating ? N  Using the Toilet? N  In the past six months, have you accidently leaked urine? Y  Do you have problems with loss of bowel  control? N  Managing your Medications? N  Managing your Finances? N  Housekeeping or managing your Housekeeping? N    Patient Care Team: Junie Spencer, FNP as PCP - General (Family Medicine) [facility] Cherlyn Cushing, RN as Oncology Nurse Navigator Allyson Sabal, Delton See, MD as Consulting Physician (Cardiology) Crist Fat, MD as Attending Physician (Urology) Margaretmary Dys, MD as Consulting Physician (Radiation Oncology) Maryclare Labrador, RN as Registered Nurse Mariea Stable, NP as Nurse Practitioner (Endocrinology)  Indicate any recent Medical Services you may have received from other than [facility] providers in the past year (date may be approximate).     Assessment:   This is a routine wellness examination for [facility].  Hearing/Vision screen Hearing Screening - Comments:: Denies hearing difficulties   Vision Screening - Comments:: Wears rx glasses - up to date with routine eye exams with [facility]     Goals Addressed             This Visit's Progress    COMPLETED: Patient Stated       03/29/2021 AWV Goal: Fall Prevention  Over the next year, patient will decrease their risk for falls by: Using assistive devices, such as a cane or walker, as needed Identifying fall risks within their home and correcting them by: Removing throw rugs Adding handrails to stairs or ramps Removing clutter and keeping a clear pathway throughout the home Increasing light, especially at night Adding shower handles/bars Raising toilet seat Identifying potential personal risk factors for falls: Medication side effects Incontinence/urgency Vestibular dysfunction Hearing loss Musculoskeletal disorders Neurological disorders Orthostatic hypotension       Remain active and independent        Depression Screen    05/23/2023    1:33 PM 02/27/2023   11:38 AM 09/17/2022    9:51 AM 08/06/2022  11:52 AM 05/16/2022    8:42 AM 03/29/2021   10:32 AM 12/18/2020    10:11 AM  PHQ 2/9 Scores  PHQ - 2 Score 0 0 0 0 0 0 0  PHQ- 9 Score  0  0   1    Fall Risk    05/19/2023    8:41 AM 02/27/2023   11:38 AM 08/06/2022   11:52 AM 05/16/2022    8:40 AM 03/29/2021   10:31 AM  Fall Risk   Falls in the past year? 1 0 0 0 1  Number falls in past yr: 0 0 0 0 1  Injury with Fall? 0 0 0 0 1  Risk for fall due to : No Fall Risks No Fall Risks No Fall Risks No Fall Risks History of fall(s)  Follow up Falls prevention discussed;Education provided;Falls evaluation completed Falls evaluation completed;Education provided Falls evaluation completed Falls prevention discussed Falls evaluation completed    MEDICARE RISK AT HOME: Medicare Risk at Home Any stairs in or around the home?: (Patient-Rptd) Yes If so, are there any without handrails?: (Patient-Rptd) No Home free of loose throw rugs in walkways, pet beds, electrical cords, etc?: (Patient-Rptd) Yes Adequate lighting in your home to reduce risk of falls?: (Patient-Rptd) Yes Life alert?: (Patient-Rptd) No Use of a cane, walker or w/c?: (Patient-Rptd) No Grab bars in the bathroom?: (Patient-Rptd) No Shower chair or bench in shower?: (Patient-Rptd) Yes Elevated toilet seat or a handicapped toilet?: (Patient-Rptd) No  TIMED UP AND GO:  Was the test performed?  No    Cognitive Function:        05/23/2023    1:34 PM 05/16/2022    8:45 AM 03/29/2021   10:28 AM  6CIT Screen  What Year? 0 points 0 points 0 points  What month? 0 points 0 points   What time? 0 points 0 points 0 points  Count back from 20 0 points 0 points 0 points  Months in reverse 0 points 0 points 0 points  Repeat phrase 0 points 0 points 0 points  Total Score 0 points 0 points     Immunizations Immunization History  Administered Date(s) Administered   Fluad Trivalent(High Dose 65+) 02/27/2023   Influenza Split 04/09/2010, 03/21/2014   Influenza, High Dose Seasonal PF 02/17/2019, 03/09/2020   Influenza,inj,Quad PF,6-35 Mos 01/22/2018    Influenza,inj,quad, With Preservative 01/07/2019   PFIZER(Purple Top)SARS-COV-2 Vaccination 05/18/2019, 06/08/2019, 01/15/2020   PNEUMOCOCCAL CONJUGATE-20 07/26/2021   Pneumococcal Polysaccharide-23 06/21/2008, 11/24/2014, 02/17/2019   Td 07/22/2001, 01/22/2018   Tdap 10/22/2010, 02/27/2023   Zoster Recombinant(Shingrix) 07/26/2021, 02/18/2022   Zoster, Live 11/05/2013    TDAP status: Up to date  Flu Vaccine status: Up to date  Pneumococcal vaccine status: Up to date  Covid-19 vaccine status: Information provided on how to obtain vaccines.   Qualifies for Shingles Vaccine? Yes   Zostavax completed Yes   Shingrix Completed?: Yes  Screening Tests Health Maintenance  Topic Date Due   COVID-19 Vaccine (4 - 2024-25 season) 12/22/2022   OPHTHALMOLOGY EXAM  05/30/2023   Diabetic kidney evaluation - eGFR measurement  02/27/2024   Diabetic kidney evaluation - Urine ACR  02/27/2024   FOOT EXAM  02/27/2024   Medicare Annual Wellness (AWV)  05/22/2024   Colonoscopy  07/04/2024   DTaP/Tdap/Td (5 - Td or Tdap) 02/26/2033   Pneumonia Vaccine 20+ Years old  Completed   INFLUENZA VACCINE  Completed   Hepatitis C Screening  Completed   Zoster Vaccines- Shingrix  Completed  HPV VACCINES  Aged Out   HEMOGLOBIN A1C  Discontinued    Health Maintenance  Health Maintenance Due  Topic Date Due   COVID-19 Vaccine (4 - 2024-25 season) 12/22/2022    Colorectal cancer screening: Type of screening: Colonoscopy. Completed 07/05/14. Repeat every 10 years  Lung Cancer Screening: (Low Dose CT Chest recommended if Age 78-80 years, 20 pack-year currently smoking OR have quit w/in 15years.) does not qualify.   Lung Cancer Screening Referral: n/a  Additional Screening:  Hepatitis C Screening: does qualify; Completed 01/26/21  Vision Screening: Recommended annual ophthalmology exams for early detection of glaucoma and other disorders of the eye. Is the patient up to date with their annual eye  exam?  Yes  Who is the provider or what is the name of the office in which the patient attends annual eye exams? [facility] If pt is not established with a provider, would they like to be referred to a provider to establish care? No .   Dental Screening: Recommended annual dental exams for proper oral hygiene  Diabetic Foot Exam: Diabetic Foot Exam: Completed 02/27/23  Community Resource Referral / Chronic Care Management: CRR required this visit?  No   CCM required this visit?  No     Plan:     I have personally reviewed and noted the following in the patient's chart:   Medical and social history Use of alcohol, tobacco or illicit drugs  Current medications and supplements including opioid prescriptions. Patient is not currently taking opioid prescriptions. Functional ability and status Nutritional status Physical activity Advanced directives List of other physicians Hospitalizations, surgeries, and ER visits in previous 12 months Vitals Screenings to include cognitive, depression, and falls Referrals and appointments  In addition, I have reviewed and discussed with patient certain preventive protocols, quality metrics, and best practice recommendations. A written personalized care plan for preventive services as well as general preventive health recommendations were provided to patient.     Kandis Fantasia [place], [place]   2/84/1324   After Visit Summary: (MyChart) Due to this being a telephonic visit, the after visit summary with patients personalized plan was offered to patient via MyChart   Nurse Notes: No concerns at this time

## 2023-06-16 LAB — HM DIABETES EYE EXAM

## 2023-08-19 ENCOUNTER — Ambulatory Visit: Payer: Medicare Other | Admitting: Family

## 2023-08-26 ENCOUNTER — Encounter: Payer: Self-pay | Admitting: Family Medicine

## 2023-09-16 ENCOUNTER — Encounter: Payer: Self-pay | Admitting: Family

## 2023-09-16 ENCOUNTER — Ambulatory Visit (INDEPENDENT_AMBULATORY_CARE_PROVIDER_SITE_OTHER): Admitting: Family

## 2023-09-16 VITALS — BP 121/79 | HR 74 | Temp 97.2°F | Ht 71.0 in | Wt 192.0 lb

## 2023-09-16 DIAGNOSIS — I10 Essential (primary) hypertension: Secondary | ICD-10-CM

## 2023-09-16 DIAGNOSIS — K219 Gastro-esophageal reflux disease without esophagitis: Secondary | ICD-10-CM

## 2023-09-16 DIAGNOSIS — M5442 Lumbago with sciatica, left side: Secondary | ICD-10-CM

## 2023-09-16 DIAGNOSIS — E1142 Type 2 diabetes mellitus with diabetic polyneuropathy: Secondary | ICD-10-CM | POA: Diagnosis not present

## 2023-09-16 DIAGNOSIS — E785 Hyperlipidemia, unspecified: Secondary | ICD-10-CM

## 2023-09-16 DIAGNOSIS — G8929 Other chronic pain: Secondary | ICD-10-CM

## 2023-09-16 DIAGNOSIS — E1169 Type 2 diabetes mellitus with other specified complication: Secondary | ICD-10-CM

## 2023-09-16 DIAGNOSIS — C61 Malignant neoplasm of prostate: Secondary | ICD-10-CM

## 2023-09-16 LAB — LIPID PANEL

## 2023-09-16 MED ORDER — LOSARTAN POTASSIUM 100 MG PO TABS: 100.0000 mg | ORAL_TABLET | Freq: Every day | ORAL | 1 refills | Status: DC

## 2023-09-16 MED ORDER — ROSUVASTATIN CALCIUM 5 MG PO TABS
5.0000 mg | ORAL_TABLET | Freq: Every day | ORAL | 1 refills | Status: DC
Start: 2023-09-16 — End: 2024-03-15

## 2023-09-16 MED ORDER — IMIPRAMINE HCL 10 MG PO TABS: 10.0000 mg | ORAL_TABLET | Freq: Every day | ORAL | 1 refills | Status: DC

## 2023-09-16 MED ORDER — FENOFIBRATE 160 MG PO TABS: 160.0000 mg | ORAL_TABLET | Freq: Every day | ORAL | 1 refills | Status: DC

## 2023-09-16 MED ORDER — AMLODIPINE BESYLATE 2.5 MG PO TABS: ORAL_TABLET | ORAL | 1 refills | Status: AC

## 2023-09-16 NOTE — Progress Notes (Signed)
 Subjective:    Patient ID: Jonathan Washington, male    DOB: 1956/03/05, 68 y.o.   MRN: [id number]  Chief Complaint  Patient presents with   Medical Management of Chronic Issues   Pt presents to the office today to chronic follow up.   He is followed by Urologists every 3 months for recurrent Prostate Cancer.   His  PET scan from 09/02/22 showed, "1. Single radiotracer avid periaortic lymph node consistent with prostate cancer nodal metastasis. 2. No evidence of local recurrence in the prostatectomy bed. No pelvic lymphadenopathy. 3. No evidence of visceral metastasis or skeletal metastasis. 4. Small RIGHT middle lobe pulmonary nodule. Favor unrelated to prostate cancer. No follow-up recommended unless risk factors for lung cancer."  He completed radiation and his PSA trending downwards.     He is followed by Endo every 6 months for DM. His last A1C was 7.1 . He states he does exercising class twice a week for 45 mins and walking after every meal every day.    His Triglycerides were elevated to 327. He is taking fenofibrate  160 mg daily Hypertension This is a chronic problem. The current episode started more than 1 year ago. The problem has been resolved since onset. The problem is controlled. Associated symptoms include malaise/fatigue. Pertinent negatives include no blurred vision, peripheral edema or shortness of breath. Risk factors for coronary artery disease include diabetes mellitus, dyslipidemia and male gender. The current treatment provides moderate improvement.  Gastroesophageal Reflux He complains of belching and heartburn. This is a chronic problem. The current episode started more than 1 year ago. The problem has been resolved. The symptoms are aggravated by certain foods. He has tried a PPI for the symptoms. The treatment provided moderate relief.  Hyperlipidemia This is a chronic problem. The current episode started more than 1 year ago. The problem is controlled.  Recent lipid tests were reviewed and are normal (LDL). Exacerbating diseases include obesity. Pertinent negatives include no shortness of breath. Current antihyperlipidemic treatment includes statins and fibric acid derivatives. The current treatment provides moderate improvement of lipids. Risk factors for coronary artery disease include dyslipidemia, hypertension, a sedentary lifestyle and post-menopausal.  Diabetes He presents for his follow-up diabetic visit. He has type 2 diabetes mellitus. Associated symptoms include foot paresthesias. Pertinent negatives for diabetes include no blurred vision. Symptoms are stable. Diabetic complications include peripheral neuropathy. Risk factors for coronary artery disease include dyslipidemia, diabetes mellitus, hypertension, male sex and sedentary lifestyle. He is following a diabetic and generally healthy diet. His overall blood glucose range is 140-180 mg/dl. Eye exam is current.  Back Pain This is a chronic problem. The current episode started more than 1 year ago. The problem occurs intermittently. The problem has been waxing and waning since onset. The pain is present in the lumbar spine. The quality of the pain is described as aching. The pain is at a severity of 0/10. The patient is experiencing no pain.      Review of Systems  Constitutional:  Positive for malaise/fatigue.  Eyes:  Negative for blurred vision.  Respiratory:  Negative for shortness of breath.   Gastrointestinal:  Positive for heartburn.  Musculoskeletal:  Positive for back pain.  All other systems reviewed and are negative.      Objective:   Physical Exam Vitals reviewed.  Constitutional:      General: He is not in acute distress.    Appearance: He is well-developed.  HENT:     Head: Normocephalic.  Right Ear: Tympanic membrane normal.     Left Ear: Tympanic membrane normal.     Ears:     Comments: Hearing aid in place Eyes:     General:        Right eye: No  discharge.        Left eye: No discharge.     Pupils: Pupils are equal, round, and reactive to light.  Neck:     Thyroid: No thyromegaly.  Cardiovascular:     Rate and Rhythm: Normal rate and regular rhythm.     Heart sounds: Normal heart sounds. No murmur heard. Pulmonary:     Effort: Pulmonary effort is normal. No respiratory distress.     Breath sounds: Normal breath sounds. No wheezing.  Abdominal:     General: Bowel sounds are normal. There is no distension.     Palpations: Abdomen is soft.     Tenderness: There is no abdominal tenderness.  Musculoskeletal:        General: No tenderness. Normal range of motion.     Cervical back: Normal range of motion and neck supple.  Skin:    General: Skin is warm and dry.     Findings: No erythema or rash.  Neurological:     Mental Status: He is alert and oriented to person, place, and time.     Cranial Nerves: No cranial nerve deficit.     Deep Tendon Reflexes: Reflexes are normal and symmetric.  Psychiatric:        Behavior: Behavior normal.        Thought Content: Thought content normal.        Judgment: Judgment normal.       BP 121/79   Pulse 74   Temp (!) 97.2 F (36.2 C) (Temporal)   Ht 5\' 11"  (1.803 m)   Wt 192 lb (87.1 kg)   SpO2 96%   BMI 26.78 kg/m      Assessment & Plan:  AMIN FORNWALT comes in today with chief complaint of Medical Management of Chronic Issues   Diagnosis and orders addressed:  1. Essential hypertension - amLODipine  (NORVASC ) 2.5 MG tablet; Take 1 tablet by mouth once daily  Dispense: 90 tablet; Refill: 1 - losartan  (COZAAR ) 100 MG tablet; Take 1 tablet (100 mg total) by mouth daily.  Dispense: 90 tablet; Refill: 1 - CMP14+EGFR  2. Hyperlipidemia associated with type 2 diabetes mellitus (HCC) - fenofibrate  160 MG tablet; Take 1 tablet (160 mg total) by mouth daily.  Dispense: 90 tablet; Refill: 1 - rosuvastatin  (CRESTOR ) 5 MG tablet; Take 1 tablet (5 mg total) by mouth daily.   Dispense: 90 tablet; Refill: 1 - CMP14+EGFR - Lipid panel  3. Recurrent adenocarcinoma of prostate (HCC) - CMP14+EGFR  4. Type 2 diabetes mellitus with other specified complication, without long-term current use of insulin (HCC) (Primary) - CMP14+EGFR  5. Gastroesophageal reflux disease, unspecified whether esophagitis present - CMP14+EGFR  6. Chronic left-sided low back pain with left-sided sciatica - CMP14+EGFR  Labs pending Keep specialists follow up Continue current medications and keep specialists appointment  Health Maintenance reviewed Diet and exercise encouraged  Follow up plan: 6 months   Tommas Fragmin, FNP

## 2023-09-16 NOTE — Patient Instructions (Signed)
 Dyslipidemia Dyslipidemia is an imbalance of waxy, fat-like substances (lipids) in the blood. The body needs lipids in small amounts. Dyslipidemia often involves a high level of cholesterol or triglycerides, which are types of lipids. Common forms of dyslipidemia include: High levels of LDL cholesterol. LDL is the type of cholesterol that causes fatty deposits (plaques) to build up in the blood vessels that carry blood away from the heart (arteries). Low levels of HDL cholesterol. HDL cholesterol is the type of cholesterol that protects against heart disease. High levels of HDL remove the LDL buildup from arteries. High levels of triglycerides. Triglycerides are a fatty substance in the blood that is linked to a buildup of plaques in the arteries. What are the causes? There are two main types of dyslipidemia: primary and secondary. Primary dyslipidemia is caused by changes (mutations) in genes that are passed down through families (inherited). These mutations cause several types of dyslipidemia. Secondary dyslipidemia may be caused by various risk factors that can lead to the disease, such as lifestyle choices and certain medical conditions. What increases the risk? You are more likely to develop this condition if you are an older man or if you are a woman who has gone through menopause. Other risk factors include: Having a family history of dyslipidemia. Taking certain medicines, including birth control pills, steroids, some diuretics, and beta-blockers. Eating a diet high in saturated fat. Smoking cigarettes or excessive alcohol intake. Having certain medical conditions such as diabetes, polycystic ovary syndrome (PCOS), kidney disease, liver disease, or hypothyroidism. Not exercising regularly. Being overweight or obese with too much belly fat. What are the signs or symptoms? In most cases, dyslipidemia does not usually cause any symptoms. In severe cases, very high lipid levels can  cause: Fatty bumps under the skin (xanthomas). A white or gray ring around the black center (pupil) of the eye. Very high triglyceride levels can cause inflammation of the pancreas (pancreatitis). How is this diagnosed? Your health care provider may diagnose dyslipidemia based on a routine blood test (fasting blood test). Because most people do not have symptoms of the condition, this blood testing (lipid profile) is done on adults age 76 and older and is repeated every 4-6 years. This test checks: Total cholesterol. This measures the total amount of cholesterol in your blood, including LDL cholesterol, HDL cholesterol, and triglycerides. A healthy number is below 200 mg/dL (4.09 mmol/L). LDL cholesterol. The target number for LDL cholesterol is different for each person, depending on individual risk factors. A healthy number is usually below 100 mg/dL (8.11 mmol/L). Ask your health care provider what your LDL cholesterol should be. HDL cholesterol. An HDL level of 60 mg/dL (9.14 mmol/L) or higher is best because it helps to protect against heart disease. A number below 40 mg/dL (7.82 mmol/L) for men or below 50 mg/dL (9.56 mmol/L) for women increases the risk for heart disease. Triglycerides. A healthy triglyceride number is below 150 mg/dL (2.13 mmol/L). If your lipid profile is abnormal, your health care provider may do other blood tests. How is this treated? Treatment depends on the type of dyslipidemia that you have and your other risk factors for heart disease and stroke. Your health care provider will have a target range for your lipid levels based on this information. Treatment for dyslipidemia starts with lifestyle changes, such as diet and exercise. Your health care provider may recommend that you: Get regular exercise. Make changes to your diet. Quit smoking if you smoke. Limit your alcohol intake. If diet  changes and exercise do not help you reach your goals, your health care provider  may also prescribe medicine to lower lipids. The most commonly prescribed type of medicine lowers your LDL cholesterol (statin drug). If you have a high triglyceride level, your provider may prescribe another type of drug (fibrate) or an omega-3 fish oil supplement, or both. Follow these instructions at home: Eating and drinking  Follow instructions from your health care provider or dietitian about eating or drinking restrictions. Eat a healthy diet as told by your health care provider. This can help you reach and maintain a healthy weight, lower your LDL cholesterol, and raise your HDL cholesterol. This may include: Limiting your calories, if you are overweight. Eating more fruits, vegetables, whole grains, fish, and lean meats. Limiting saturated fat, trans fat, and cholesterol. Do not drink alcohol if: Your health care provider tells you not to drink. You are pregnant, may be pregnant, or are planning to become pregnant. If you drink alcohol: Limit how much you have to: 0-1 drink a day for women. 0-2 drinks a day for men. Know how much alcohol is in your drink. In the [place] one drink equals one 12 oz bottle of beer (355 mL), one 5 oz glass of wine (148 mL), or one 1 oz glass of hard liquor (44 mL). Activity Get regular exercise. Start an exercise and strength training program as told by your health care provider. Ask your health care provider what activities are safe for you. Your health care provider may recommend: 30 minutes of aerobic activity 4-6 days a week. Brisk walking is an example of aerobic activity. Strength training 2 days a week. General instructions Do not use any products that contain nicotine or tobacco. These products include cigarettes, chewing tobacco, and vaping devices, such as e-cigarettes. If you need help quitting, ask your health care provider. Take over-the-counter and prescription medicines only as told by your health care provider. This includes  supplements. Keep all follow-up visits. This is important. Contact a health care provider if: You are having trouble sticking to your exercise or diet plan. You are struggling to quit smoking or to control your use of alcohol. Summary Dyslipidemia often involves a high level of cholesterol or triglycerides, which are types of lipids. Treatment depends on the type of dyslipidemia that you have and your other risk factors for heart disease and stroke. Treatment for dyslipidemia starts with lifestyle changes, such as diet and exercise. Your health care provider may prescribe medicine to lower lipids. This information is not intended to replace advice given to you by your health care provider. Make sure you discuss any questions you have with your health care provider. Document Revised: 11/09/2021 Document Reviewed: 06/12/2020 Elsevier Patient Education  2024 [employer].

## 2023-09-17 LAB — CMP14+EGFR
ALT: 35 IU/L (ref 0–44)
AST: 33 IU/L (ref 0–40)
Albumin: 4.6 g/dL (ref 3.9–4.9)
Alkaline Phosphatase: 97 IU/L (ref 44–121)
BUN/Creatinine Ratio: 24 (ref 10–24)
BUN: 23 mg/dL (ref 8–27)
Bilirubin Total: 0.5 mg/dL (ref 0.0–1.2)
CO2: 21 mmol/L (ref 20–29)
Calcium: 9.2 mg/dL (ref 8.6–10.2)
Chloride: 101 mmol/L (ref 96–106)
Creatinine, Ser: 0.97 mg/dL (ref 0.76–1.27)
Globulin, Total: 2.3 g/dL (ref 1.5–4.5)
Glucose: 170 mg/dL — ABNORMAL HIGH (ref 70–99)
Potassium: 4.4 mmol/L (ref 3.5–5.2)
Sodium: 140 mmol/L (ref 134–144)
Total Protein: 6.9 g/dL (ref 6.0–8.5)
eGFR: 84 mL/min/{1.73_m2} (ref >59)

## 2023-09-17 LAB — LIPID PANEL
Cholesterol, Total: 108 mg/dL (ref 100–199)
HDL: 17 mg/dL — ABNORMAL LOW (ref >39)
LDL CALC COMMENT:: 6.4 ratio — ABNORMAL HIGH (ref 0.0–5.0)
LDL Chol Calc (NIH): 41 mg/dL (ref 0–99)
Triglycerides: 328 mg/dL — ABNORMAL HIGH (ref 0–149)
VLDL Cholesterol Cal: 50 mg/dL — ABNORMAL HIGH (ref 5–40)

## 2023-09-18 ENCOUNTER — Ambulatory Visit: Payer: Self-pay | Admitting: Family

## 2023-09-19 NOTE — Progress Notes (Signed)
 done

## 2023-10-14 ENCOUNTER — Ambulatory Visit: Payer: Self-pay

## 2023-10-14 NOTE — Telephone Encounter (Signed)
 FYI Only or Action Required?: FYI only for provider.  Patient was last seen in primary care on 09/16/2023 by Lavell Bari LABOR, FNP. Called Nurse Triage reporting Fall. Symptoms began 8 to 10 days ago. Interventions attempted: OTC medications: ibuprofen. Symptoms are: unchanged                                               Triage Disposition: See PCP When Office is Open (Within 3 Days)  Patient/caregiver understands and will follow disposition?: Yes   Copied from CRM [id number]. Topic: Clinical - Red Word Triage >> Oct 14, 2023  8:02 AM Rosaria BRAVO wrote: Red Word that prompted transfer to Nurse Triage: Pt fell recently, and has been experiencing soreness in his shoulders/chest.   Seeking X-ray Reason for Disposition  [1] MODERATE pain (e.g., interferes with normal activities) AND [2] present > 3 days  MILD weakness (i.e., does not interfere with ability to work, go to school, normal activities)  (Exception: Mild weakness is a chronic symptom.)  Answer Assessment - Initial Assessment Questions 1. MECHANISM: How did the fall happen?     Stepped off curbing 2. DOMESTIC VIOLENCE AND ELDER ABUSE SCREENING: Did you fall because someone pushed you or tried to hurt you? If Yes, ask: Are you safe now?     no 3. ONSET: When did the fall happen? (e.g., minutes, hours, or days ago)     8 to 10 days ago 4. LOCATION: What part of the body hit the ground? (e.g., back, buttocks, head, hips, knees, hands, head, stomach)     Right shoulder 5. INJURY: Did you hurt (injure) yourself when you fell? If Yes, ask: What did you injure? Tell me more about this? (e.g., body area; type of injury; pain severity)     Right shoulder 6. PAIN: Is there any pain? If Yes, ask: How bad is the pain? (e.g., Scale 1-10; or mild,  moderate, severe)   - NONE (0): No pain   - MILD (1-3): Doesn't interfere with normal activities    - MODERATE (4-7): Interferes with normal activities or awakens from sleep    -  SEVERE (8-10): Excruciating pain, unable to do any normal activities      5/10 7. SIZE: For cuts, bruises, or swelling, ask: How large is it? (e.g., inches or centimeters)      N/a 8. PREGNANCY: Is there any chance you are pregnant? When was your last menstrual period?     N/a 9. OTHER SYMPTOMS: Do you have any other symptoms? (e.g., dizziness, fever, weakness; new onset or worsening).      Sore or pulled muscle after fall 10. CAUSE: What do you think caused the fall (or falling)? (e.g., tripped, dizzy spell)       N/a  Answer Assessment - Initial Assessment Questions 1. ONSET: When did the pain start?     8 to 10 days ago 2. LOCATION: Where is the pain located?     Right shoulder 3. PAIN: How bad is the pain? (Scale 1-10; or mild, moderate, severe)   - MILD (1-3): Doesn't interfere with normal activities.   - MODERATE (4-7): Interferes with normal activities (e.g., work or school) or awakens from sleep.   - SEVERE (8-10): Excruciating pain, unable to do any normal activities, unable to hold a cup of water .     5/10 4. WORK  OR EXERCISE: Has there been any recent work or exercise that involved this part of the body?     Pt fell and landed on right arm 5. CAUSE: What do you think is causing the arm pain?     fall 6. OTHER SYMPTOMS: Do you have any other symptoms? (e.g., neck pain, swelling, rash, fever, numbness, weakness)     no 7. PREGNANCY: Is there any chance you are pregnant? When was your last menstrual period?     N/a  Protocols used: Falls and Falling-A-AH, Arm Pain-A-AH

## 2023-10-15 ENCOUNTER — Ambulatory Visit: Admitting: Family Medicine

## 2023-10-15 ENCOUNTER — Encounter: Payer: Self-pay | Admitting: Family Medicine

## 2023-10-15 ENCOUNTER — Ambulatory Visit

## 2023-10-15 VITALS — BP 113/66 | HR 73 | Temp 97.9°F | Ht 71.0 in | Wt 192.0 lb

## 2023-10-15 DIAGNOSIS — S20211A Contusion of right front wall of thorax, initial encounter: Secondary | ICD-10-CM

## 2023-10-15 MED ORDER — TRAMADOL HCL 50 MG PO TABS: 50.0000 mg | ORAL_TABLET | Freq: Four times a day (QID) | ORAL | 0 refills | Status: AC | PRN

## 2023-10-15 MED ORDER — DICLOFENAC SODIUM 75 MG PO TBEC: 75.0000 mg | DELAYED_RELEASE_TABLET | Freq: Two times a day (BID) | ORAL | 20 refills | Status: AC

## 2023-10-15 NOTE — Progress Notes (Signed)
 Subjective:  Patient ID: Jonathan Washington, male    DOB: Aug 20, 1955  Age: 68 y.o. MRN: [id number]  CC: Fall (Pt fell stepping of a curb about 8 to 10 days ago. Right peck pain started the next day. Pain is no better if anything it is worse. Not bothersome unless he goes to turn or rotate. Pain starting to radiate into that shoulder. ROM not effected. Hurts to the touch in one spot.  )   HPI Jonathan Washington presents for injury to the right upper chest and shoulder about 10 days ago.  He fell off of a curb landed on the anterior aspect of the right shoulder.  He can move the shoulder just fine.  However he is having 4/10 pain at the upper right chest.  There is no shortness of breath.  There is no hemoptysis.  However it is tender and when he coughs or sneezes the pain goes to 10/10 momentarily only.  It seems to be getting somewhat worse with time.     09/16/2023    8:32 AM 09/16/2023    8:00 AM 05/23/2023    1:33 PM  Depression screen PHQ 2/9  Decreased Interest 0 0 0  Down, Depressed, Hopeless 0 0 0  PHQ - 2 Score 0 0 0  Altered sleeping 1    Tired, decreased energy 3    Change in appetite 1    Feeling bad or failure about yourself  0    Trouble concentrating 0    Moving slowly or fidgety/restless 0    Suicidal thoughts 0    PHQ-9 Score 5    Difficult doing work/chores Not difficult at all      History Jonathan Washington has a past medical history of Arthritis, Cataract, Dyslipidemia, Fatty liver, Fibromyalgia, GERD (gastroesophageal reflux disease), Glaucoma, Glucose intolerance (impaired glucose tolerance), Hypertension, Pneumonia, Prostate cancer (HCC), and Vertigo.   He has a past surgical history that includes Cholecystectomy; LEFT KNEE ARTHROSCOPY; SURGERY FOR DEVIATED NASAL SEPTUM; Robot assisted laparoscopic radical prostatectomy (05/27/2012); Lymphadenectomy (05/27/2012); and Eye surgery (Bilateral).   His family history includes Bladder Cancer in his father; Breast cancer in his  mother; Cancer in his mother; Hypertension in his maternal grandmother; Lung cancer in his paternal aunt; Throat cancer in his father.He reports that he quit smoking about 45 years ago. His smoking use included cigarettes and cigars. He started smoking about 60 years ago. He has a 30 pack-year smoking history. He has never used smokeless tobacco. He reports current alcohol use of about 2.0 standard drinks of alcohol per week. He reports that he does not use drugs.    ROS Review of Systems  Constitutional:  Negative for fever.  Respiratory:  Negative for shortness of breath.   Cardiovascular:  Negative for chest pain.  Musculoskeletal:  Negative for arthralgias.  Skin:  Negative for rash.    Objective:  BP 113/66   Pulse 73   Temp 97.9 F (36.6 C)   Ht 5' 11 (1.803 m)   Wt 192 lb (87.1 kg)   SpO2 94%   BMI 26.78 kg/m   BP Readings from Last 3 Encounters:  10/15/23 113/66  09/16/23 121/79  02/27/23 113/73    Wt Readings from Last 3 Encounters:  10/15/23 192 lb (87.1 kg)  09/16/23 192 lb (87.1 kg)  05/23/23 188 lb (85.3 kg)     Physical Exam Vitals reviewed.  Constitutional:      Appearance: He is well-developed.  HENT:  Head: Normocephalic and atraumatic.     Right Ear: External ear normal.     Left Ear: External ear normal.     Mouth/Throat:     Pharynx: No oropharyngeal exudate or posterior oropharyngeal erythema.   Eyes:     Pupils: Pupils are equal, round, and reactive to light.    Cardiovascular:     Rate and Rhythm: Normal rate and regular rhythm.     Heart sounds: No murmur heard. Pulmonary:     Effort: No respiratory distress.     Breath sounds: Normal breath sounds.   Musculoskeletal:        General: Tenderness (Anterior chest wall at the midclavicular line approximately rib 4 there is point tenderness.) present.     Cervical back: Normal range of motion and neck supple.   Neurological:     Mental Status: He is alert and oriented to person,  place, and time.    EGFR = 84%  Assessment & Plan:  Chest wall contusion, right, initial encounter -     DG Shoulder Right; Future -     DG Ribs Unilateral W/Chest Right; Future  Other orders -     traMADol HCl; Take 1 tablet (50 mg total) by mouth 4 (four) times daily as needed for up to 5 days for moderate pain (pain score 4-6).  Dispense: 20 tablet; Refill: 0 -     Diclofenac  Sodium; Take 1 tablet (75 mg total) by mouth 2 (two) times daily. For muscle and  Joint pain  Dispense: 60 tablet; Refill: 20     Follow-up: Return if symptoms worsen or fail to improve.  Butler Der, M.D.

## 2023-10-30 ENCOUNTER — Ambulatory Visit: Payer: Self-pay | Admitting: Family Medicine

## 2023-12-19 LAB — HM DIABETES EYE EXAM

## 2024-01-16 ENCOUNTER — Ambulatory Visit
Admission: RE | Admit: 2024-01-16 | Discharge: 2024-01-16 | Disposition: A | Discharge status: Home / Self Care | Attending: Family Medicine | Admitting: Family Medicine

## 2024-01-16 VITALS — BP 127/83 | HR 86 | Temp 98.4°F | Resp 18

## 2024-01-16 DIAGNOSIS — N390 Urinary tract infection, site not specified: Secondary | ICD-10-CM

## 2024-01-16 LAB — POCT URINALYSIS DIP (MANUAL ENTRY)
Bilirubin, UA: NEGATIVE
Glucose, UA: >=1000 mg/dL — AB
Ketones, POC UA: NEGATIVE mg/dL
Nitrite, UA: NEGATIVE
Spec Grav, UA: 1.01 (ref 1.010–1.025)
Urobilinogen, UA: 1 U/dL
pH, UA: 6.5 (ref 5.0–8.0)

## 2024-01-16 MED ORDER — SULFAMETHOXAZOLE-TRIMETHOPRIM 800-160 MG PO TABS: 1.0000 | ORAL_TABLET | Freq: Two times a day (BID) | ORAL | 0 refills | Status: AC

## 2024-01-16 NOTE — ED Provider Notes (Signed)
 [facility]    CSN: [id number] Arrival date & time: 01/16/24  0920      History   Chief Complaint Chief Complaint  Patient presents with   Urinary Frequency    cloudy urine, frequent urine, some burning pain at start,  General body aches - Entered by patient    HPI Jonathan Washington is a 68 y.o. male.   Three days ago patient suddenly developed cloudy urine, urgency, frequency, and nocturia.  He has been mildly fatigued but denies fevers, chills, and sweats.  He has had mild nausea without vomiting and denies abdominal, pelvic and flank pain.  He has a history of prostate CA, status post prostatectomy 05/2012, followed by radiation therapy (last occurrence in 2024). To his knowledge he has never had a UTI.  The history is provided by the patient and the spouse.  Urinary Frequency This is a new problem. Episode onset: 3 days ago. The problem has not changed since onset.Pertinent negatives include no abdominal pain. Nothing aggravates the symptoms. Nothing relieves the symptoms. He has tried nothing for the symptoms.    Past Medical History:  Diagnosis Date   Arthritis    Cataract    Dyslipidemia    Fatty liver    Fibromyalgia    GERD (gastroesophageal reflux disease)    Glaucoma    RIGHT EYE   Glucose intolerance (impaired glucose tolerance)    Hypertension    Pneumonia    4 YRS AGO   Prostate cancer (HCC)    Vertigo    ESPECIALLY IF PT TURNS HIS HEAD OR MOVES A CERTAIN WAY    Patient Active Problem List   Diagnosis Date Noted   Essential hypertension 12/17/2022   Malignant neoplasm of prostate metastatic to intrapelvic lymph node (HCC) 09/20/2022   Low back pain 11/07/2021   Metacarpal bone fracture 12/21/2020   Diabetes mellitus (HCC) 07/27/2020   Gastroesophageal reflux disease 07/27/2020   Hyperlipidemia associated with type 2 diabetes mellitus (HCC) 07/27/2020   H/O prostate cancer 07/27/2020   Arthritis of carpometacarpal (CMC) joint of thumb  12/01/2019   Vestibular hypofunction of left ear 12/17/2018   Recurrent adenocarcinoma of prostate (HCC) 08/11/2018   Sensorineural hearing loss (SNHL), bilateral 01/06/2018   Vertigo 01/06/2018    Past Surgical History:  Procedure Laterality Date   CHOLECYSTECTOMY     EYE SURGERY Bilateral    cataracts   LEFT KNEE ARTHROSCOPY     LYMPHADENECTOMY  05/27/2012   Procedure: LYMPHADENECTOMY;  Surgeon: Alm GORMAN Fragmin, MD;  Location: [facility];  Service: Urology;  Laterality: Bilateral;   ROBOT ASSISTED LAPAROSCOPIC RADICAL PROSTATECTOMY  05/27/2012   Procedure: ROBOTIC ASSISTED LAPAROSCOPIC RADICAL PROSTATECTOMY;  Surgeon: Alm GORMAN Fragmin, MD;  Location: [facility];  Service: Urology;  Laterality: N/A;   SURGERY FOR DEVIATED NASAL SEPTUM         Home Medications    Prior to Admission medications   Medication Sig Start Date End Date Taking? Authorizing Provider  amLODipine  (NORVASC ) 2.5 MG tablet Take 1 tablet by mouth once daily 09/16/23  Yes Hawks, Christy A, FNP  aspirin 81 MG tablet Take 81 mg by mouth.   Yes [provider]  Continuous Blood Gluc Sensor (FREESTYLE LIBRE 2 SENSOR) MISC USE AS DIRECTED EVERY  14  DAYS   Yes [provider]  diclofenac  (VOLTAREN ) 75 MG EC tablet Take 1 tablet (75 mg total) by mouth 2 (two) times daily. For muscle and  Joint pain 10/15/23  Yes Stacks,  Butler, MD  fenofibrate  160 MG tablet Take 1 tablet (160 mg total) by mouth daily. 09/16/23  Yes Hawks, Christy A, FNP  ibuprofen (ADVIL) 200 MG tablet Take 200 mg by mouth as needed for moderate pain.   Yes [provider]  imipramine  (TOFRANIL ) 10 MG tablet Take 1 tablet (10 mg total) by mouth at bedtime. 09/16/23  Yes Hawks, Christy A, FNP  JARDIANCE 10 MG TABS tablet Take 10 mg by mouth daily. 01/17/22  Yes [provider]  losartan  (COZAAR ) 100 MG tablet Take 1 tablet (100 mg total) by mouth daily. 09/16/23  Yes Hawks, Christy A, FNP  MELATONIN PO Take by mouth as needed (sleep).    Yes [provider]  metFORMIN (GLUCOPHAGE-XR) 500 MG 24 hr tablet Take 500 mg by mouth 3 (three) times daily.   Yes [provider]  omeprazole (PRILOSEC) 20 MG capsule Take 20 mg by mouth daily as needed. For heartburn   Yes [provider]  rosuvastatin  (CRESTOR ) 5 MG tablet Take 1 tablet (5 mg total) by mouth daily. 09/16/23  Yes Hawks, Christy A, FNP  sildenafil (VIAGRA) 100 MG tablet Take 100 mg by mouth daily as needed for erectile dysfunction.   Yes [provider]  sulfamethoxazole -trimethoprim  (BACTRIM  DS) 800-160 MG tablet Take 1 tablet by mouth 2 (two) times daily. 01/16/24  Yes Pauline Garnette LABOR, MD  tamsulosin (FLOMAX) 0.4 MG CAPS capsule Take 0.4 mg by mouth daily. 02/02/22  Yes [provider]  timolol  (TIMOPTIC ) 0.5 % ophthalmic solution Place 1 drop into the right eye 2 (two) times daily.   Yes [provider]    Family History Family History  Problem Relation Age of Onset   Breast cancer Mother    Cancer Mother    Bladder Cancer Father    Throat cancer Father    Lung cancer Paternal Aunt        non smoker   Hypertension Maternal Grandmother     Social History Social History   Tobacco Use   Smoking status: Former    Current packs/day: 0.00    Average packs/day: 2.0 packs/day for 15.0 years (30.0 ttl pk-yrs)    Types: Cigarettes, Cigars    Start date: 04/23/1963    Quit date: 04/22/1978    Years since quitting: 45.7   Smokeless tobacco: Never   Tobacco comments:    QUIT SMOKING 1980  Vaping Use   Vaping status: Never Used  Substance Use Topics   Alcohol use: Yes    Alcohol/week: 2.0 standard drinks of alcohol    Types: 2 Cans of beer per week   Drug use: No     Allergies   Niacin   Review of Systems Review of Systems  Constitutional:  Positive for appetite change and fatigue. Negative for activity change, chills, diaphoresis and fever.  Gastrointestinal:  Negative for abdominal pain.  Genitourinary:   Positive for dysuria, frequency and urgency. Negative for difficulty urinating, flank pain, hematuria, penile discharge and testicular pain.  Musculoskeletal:  Positive for myalgias.  All other systems reviewed and are negative.    Physical Exam Triage Vital Signs ED Triage Vitals  Encounter Vitals Group     BP 01/16/24 0936 127/83     Girls Systolic BP Percentile --      Girls Diastolic BP Percentile --      Boys Systolic BP Percentile --      Boys Diastolic BP Percentile --      Pulse Rate 01/16/24  0936 86     Resp 01/16/24 0936 18     Temp 01/16/24 0936 98.4 F (36.9 C)     Temp Source 01/16/24 0936 Oral     SpO2 01/16/24 0936 95 %     Weight --      Height --      Head Circumference --      Peak Flow --      Pain Score 01/16/24 0937 0     Pain Loc --      Pain Education --      Exclude from Growth Chart --    No data found.  Updated Vital Signs BP 127/83 (BP Location: Right Arm)   Pulse 86   Temp 98.4 F (36.9 C) (Oral)   Resp 18   SpO2 95%   Visual Acuity Right Eye Distance:   Left Eye Distance:   Bilateral Distance:    Right Eye Near:   Left Eye Near:    Bilateral Near:     Physical Exam Nursing notes and Vital Signs reviewed. Appearance:  Patient appears stated age, and in no acute distress.    Eyes:  Pupils are equal, round, and reactive to light and accomodation.  Extraocular movement is intact.  Conjunctivae are not inflamed   Pharynx:  Normal; moist mucous membranes  Neck:  Supple.  No adenopathy Lungs:  Clear to auscultation.  Breath sounds are equal.  Moving air well. Heart:  Regular rate and rhythm without murmurs, rubs, or gallops.  Abdomen:  Nontender without masses or hepatosplenomegaly.  Bowel sounds are present.  No CVA or flank tenderness.  Extremities:  No edema.  Skin:  No rash present.     UC Treatments / Results  Labs (all labs ordered are listed, but only abnormal results are displayed) Labs Reviewed  POCT URINALYSIS DIP  (MANUAL ENTRY) - Abnormal; Notable for the following components:      Result Value   Color, UA light yellow (*)    Clarity, UA cloudy (*)    Glucose, UA >=1,000 (*)    Blood, UA moderate (*)    Protein Ur, POC trace (*)    Leukocytes, UA Small (1+) (*)    All other components within normal limits  URINE CULTURE    EKG   Radiology No results found.  Procedures Procedures (including critical care time)  Medications Ordered in UC Medications - No data to display  Initial Impression / Assessment and Plan / UC Course  I have reviewed the triage vital signs and the nursing notes.  Pertinent labs & imaging results that were available during my care of the patient were reviewed by me and considered in my medical decision making (see chart for details).    Urine culture pending.  Begin Bactrim  DS, one BID for one week.  Note glucosuria on urinalysis.  Advised to check glucose more frequently.  Followup with Family Doctor if not improved in one week.  Followup with urologist in October as scheduled.  Final Clinical Impressions(s) / UC Diagnoses   Final diagnoses:  Urinary tract infection without hematuria, site unspecified     Discharge Instructions      Increase fluid intake.  If symptoms become significantly worse during the night or over the weekend, proceed to the local emergency room.     ED Prescriptions     Medication Sig Dispense Auth. Provider   sulfamethoxazole -trimethoprim  (BACTRIM  DS) 800-160 MG tablet Take 1 tablet by mouth 2 (two) times daily. 14  tablet Pauline Garnette LABOR, MD         Pauline Garnette LABOR, MD 01/16/24 [id number]

## 2024-01-16 NOTE — Discharge Instructions (Signed)
 Increase fluid intake. If symptoms become significantly worse during the night or over the weekend, proceed to the local emergency room.

## 2024-01-16 NOTE — ED Triage Notes (Signed)
 Patient c/o urinary urgency, dysuria and cloudy x 2 days.  Patient has a history or prostate cancer, prostatectomy in 05/2012.  Some body aches, taken Ibuprofen.

## 2024-01-18 LAB — URINE CULTURE
Culture: >=100000 — AB
Special Requests: NORMAL

## 2024-01-19 ENCOUNTER — Ambulatory Visit (HOSPITAL_COMMUNITY): Payer: Self-pay

## 2024-03-10 LAB — LAB REPORT - SCANNED
A1c: 6.8
EGFR: 81

## 2024-03-14 ENCOUNTER — Other Ambulatory Visit: Payer: Self-pay | Admitting: Family

## 2024-03-14 DIAGNOSIS — E1169 Type 2 diabetes mellitus with other specified complication: Secondary | ICD-10-CM

## 2024-03-15 ENCOUNTER — Ambulatory Visit: Payer: Self-pay | Admitting: Family

## 2024-04-01 ENCOUNTER — Ambulatory Visit: Admitting: Family

## 2024-04-01 ENCOUNTER — Encounter: Payer: Self-pay | Admitting: Family

## 2024-04-01 VITALS — BP 136/78 | HR 73 | Temp 97.8°F | Wt 200.2 lb

## 2024-04-01 DIAGNOSIS — C775 Secondary and unspecified malignant neoplasm of intrapelvic lymph nodes: Secondary | ICD-10-CM | POA: Diagnosis not present

## 2024-04-01 DIAGNOSIS — E781 Pure hyperglyceridemia: Secondary | ICD-10-CM

## 2024-04-01 DIAGNOSIS — Z23 Encounter for immunization: Secondary | ICD-10-CM

## 2024-04-01 DIAGNOSIS — C61 Malignant neoplasm of prostate: Secondary | ICD-10-CM | POA: Diagnosis not present

## 2024-04-01 DIAGNOSIS — E785 Hyperlipidemia, unspecified: Secondary | ICD-10-CM | POA: Diagnosis not present

## 2024-04-01 DIAGNOSIS — E1169 Type 2 diabetes mellitus with other specified complication: Secondary | ICD-10-CM | POA: Diagnosis not present

## 2024-04-01 DIAGNOSIS — K219 Gastro-esophageal reflux disease without esophagitis: Secondary | ICD-10-CM

## 2024-04-01 DIAGNOSIS — I10 Essential (primary) hypertension: Secondary | ICD-10-CM | POA: Diagnosis not present

## 2024-04-01 NOTE — Progress Notes (Signed)
 Subjective:    Patient ID: Jonathan Washington, male    DOB: 08-05-1955, 68 y.o.   MRN: [id number]  Chief Complaint  Patient presents with   Medical Management of Chronic Issues   Pt presents to the office today to chronic follow up.   He is followed by Urologists every 3 months for recurrent Prostate Cancer.   His  PET scan from 09/02/22 showed, 1. Single radiotracer avid periaortic lymph node consistent with prostate cancer nodal metastasis. 2. No evidence of local recurrence in the prostatectomy bed. No pelvic lymphadenopathy. 3. No evidence of visceral metastasis or skeletal metastasis. 4. Small RIGHT middle lobe pulmonary nodule. Favor unrelated to prostate cancer. No follow-up recommended unless risk factors for lung cancer.  He completed radiation and his PSA trending downwards.     He is followed by Endo every 6 months for DM. His last A1C was 6.8 . He states he does exercising class twice a week for 45 mins and walking after every meal every day. His Jardiance stopped for frequent UTI.   His Triglycerides were elevated to 600. He is taking fenofibrate  160 mg daily. Hypertension This is a chronic problem. The current episode started more than 1 year ago. The problem has been resolved since onset. The problem is controlled. Associated symptoms include malaise/fatigue. Pertinent negatives include no blurred vision, peripheral edema or shortness of breath. Risk factors for coronary artery disease include diabetes mellitus, dyslipidemia and male gender. The current treatment provides moderate improvement.  Gastroesophageal Reflux He complains of belching and heartburn. This is a chronic problem. The current episode started more than 1 year ago. The problem occurs rarely. The problem has been resolved. The symptoms are aggravated by certain foods. He has tried a PPI for the symptoms. The treatment provided moderate relief.  Hyperlipidemia This is a chronic problem. The current  episode started more than 1 year ago. The problem is controlled. Recent lipid tests were reviewed and are normal (LDL). Exacerbating diseases include obesity. Pertinent negatives include no shortness of breath. Current antihyperlipidemic treatment includes statins and fibric acid derivatives. The current treatment provides moderate improvement of lipids. Risk factors for coronary artery disease include dyslipidemia, hypertension, a sedentary lifestyle, post-menopausal and male sex.  Diabetes He presents for his follow-up diabetic visit. He has type 2 diabetes mellitus. Associated symptoms include foot paresthesias. Pertinent negatives for diabetes include no blurred vision. Symptoms are stable. Diabetic complications include peripheral neuropathy. Risk factors for coronary artery disease include dyslipidemia, diabetes mellitus, hypertension, male sex and sedentary lifestyle. He is following a diabetic and generally healthy diet. His overall blood glucose range is 140-180 mg/dl. Eye exam is current.  Back Pain This is a chronic problem. The current episode started more than 1 year ago. The problem occurs intermittently. The problem has been waxing and waning since onset. The pain is present in the lumbar spine. The quality of the pain is described as aching. The pain is at a severity of 0/10. The patient is experiencing no pain. He has tried bed rest for the symptoms. The treatment provided mild relief.      Review of Systems  Constitutional:  Positive for malaise/fatigue.  Eyes:  Negative for blurred vision.  Respiratory:  Negative for shortness of breath.   Gastrointestinal:  Positive for heartburn.  Musculoskeletal:  Positive for back pain.  All other systems reviewed and are negative.  Family History  Problem Relation Age of Onset   Breast cancer Mother    Cancer Mother  Bladder Cancer Father    Throat cancer Father    Lung cancer Paternal Aunt        non smoker   Hypertension Maternal  Grandmother    Social History   Socioeconomic History   Marital status: Married    Spouse name: Not on file   Number of children: 2   Years of education: Not on file   Highest education level: Associate degree: occupational, scientist, product/process development, or vocational program  Occupational History    Comment: retired since 2015  Tobacco Use   Smoking status: Former    Current packs/day: 0.00    Average packs/day: 2.0 packs/day for 15.0 years (30.0 ttl pk-yrs)    Types: Cigarettes, Cigars    Start date: 04/23/1963    Quit date: 04/22/1978    Years since quitting: 45.9   Smokeless tobacco: Never   Tobacco comments:    QUIT SMOKING 1980  Vaping Use   Vaping status: Never Used  Substance and Sexual Activity   Alcohol use: Yes    Alcohol/week: 2.0 standard drinks of alcohol    Types: 2 Cans of beer per week   Drug use: No   Sexual activity: Yes    Birth control/protection: None    Comment: with aid of Viagra  Other Topics Concern   Not on file  Social History Narrative   Not on file   Social Drivers of Health   Tobacco Use: Medium Risk (04/01/2024)   Patient History    Smoking Tobacco Use: Former    Smokeless Tobacco Use: Never    Passive Exposure: Not on Actuary Strain: Low Risk (03/31/2024)   Overall Financial Resource Strain (CARDIA)    Difficulty of Paying Living Expenses: Not hard at all  Food Insecurity: No Food Insecurity (03/31/2024)   Epic    Worried About Programme Researcher, Broadcasting/film/video in the Last Year: Never true    Ran Out of Food in the Last Year: Never true  Transportation Needs: No Transportation Needs (03/31/2024)   Epic    Lack of Transportation (Medical): No    Lack of Transportation (Non-Medical): No  Physical Activity: Sufficiently Active (03/31/2024)   Exercise Vital Sign    Days of Exercise per Week: 7 days    Minutes of Exercise per Session: 60 min  Stress: No Stress Concern Present (03/31/2024)   [employer] of Occupational Health - Occupational  Stress Questionnaire    Feeling of Stress: Only a little  Social Connections: Unknown (03/31/2024)   Social Connection and Isolation Panel    Frequency of Communication with Friends and Family: Twice a week    Frequency of Social Gatherings with Friends and Family: Once a week    Attends Religious Services: More than 4 times per year    Active Member of Clubs or Organizations: Not on file    Attends Banker Meetings: Not on file    Marital Status: Married  Depression (PHQ2-9): Medium Risk (09/16/2023)   Depression (PHQ2-9)    PHQ-2 Score: 5  Alcohol Screen: Low Risk (03/31/2024)   Alcohol Screen    Last Alcohol Screening Score (AUDIT): 2  Housing: Low Risk (03/31/2024)   Epic    Unable to Pay for Housing in the Last Year: No    Number of Times Moved in the Last Year: 0    Homeless in the Last Year: No  Utilities: Not At Risk (05/23/2023)   [employer] Utilities    Threatened with loss of utilities: No  Health Literacy: Adequate Health Literacy (05/23/2023)   B1300 Health Literacy    Frequency of need for help with medical instructions: Never        Objective:   Physical Exam Vitals reviewed.  Constitutional:      General: He is not in acute distress.    Appearance: He is well-developed.  HENT:     Head: Normocephalic.     Right Ear: Tympanic membrane normal.     Left Ear: Tympanic membrane normal.     Ears:     Comments: Hearing aid in place Eyes:     General:        Right eye: No discharge.        Left eye: No discharge.     Pupils: Pupils are equal, round, and reactive to light.  Neck:     Thyroid: No thyromegaly.  Cardiovascular:     Rate and Rhythm: Normal rate and regular rhythm.     Heart sounds: Normal heart sounds. No murmur heard. Pulmonary:     Effort: Pulmonary effort is normal. No respiratory distress.     Breath sounds: Normal breath sounds. No wheezing.  Abdominal:     General: Bowel sounds are normal. There is no distension.     Palpations:  Abdomen is soft.     Tenderness: There is no abdominal tenderness.  Musculoskeletal:        General: No tenderness. Normal range of motion.     Cervical back: Normal range of motion and neck supple.  Skin:    General: Skin is warm and dry.     Findings: No erythema or rash.  Neurological:     Mental Status: He is alert and oriented to person, place, and time.     Cranial Nerves: No cranial nerve deficit.     Deep Tendon Reflexes: Reflexes are normal and symmetric.  Psychiatric:        Behavior: Behavior normal.        Thought Content: Thought content normal.        Judgment: Judgment normal.     Diabetic Foot Exam - Simple   Simple Foot Form Diabetic Foot exam was performed with the following findings: Yes 04/01/2024 11:35 AM  Visual Inspection No deformities, no ulcerations, no other skin breakdown bilaterally: Yes Sensation Testing Intact to touch and monofilament testing bilaterally: Yes Pulse Check Posterior Tibialis and Dorsalis pulse intact bilaterally: Yes Comments      BP 136/78   Pulse 73   Temp 97.8 F (36.6 C) (Temporal)   Wt 200 lb 3.2 oz (90.8 kg)   SpO2 96%   BMI 27.92 kg/m      Assessment & Plan:  Jonathan Washington comes in today with chief complaint of Medical Management of Chronic Issues   Diagnosis and orders addressed:  1. Malignant neoplasm of prostate metastatic to intrapelvic lymph node (HCC) -Keep follow up with Urologists  2. Hyperlipidemia associated with type 2 diabetes mellitus (HCC) Low fat diet, continue Crestor   3. Encounter for immunization - Flu vaccine HIGH DOSE PF(Fluzone Trivalent)  4. Type 2 diabetes mellitus with other specified complication, without long-term current use of insulin (HCC) (Primary) Low carb diet  Keep follow up with Endo - Microalbumin / creatinine urine ratio  5. Essential hypertension At goal today  6. Gastroesophageal reflux disease, unspecified whether esophagitis present -Diet discussed-  Avoid fried, spicy, citrus foods, caffeine and alcohol -Do not eat 2-3 hours before bedtime -Encouraged small frequent meals -Avoid NSAID's  7. Hypertriglyceridemia Low fat diet   Labs pending Keep specialists follow up Continue current medications and keep specialists appointment  Health Maintenance reviewed Diet and exercise encouraged  Follow up plan: 6 months   Bari Learn, FNP

## 2024-04-01 NOTE — Patient Instructions (Signed)
High Triglycerides Eating Plan ?Triglycerides are a type of fat in the blood. High levels of triglycerides can increase your risk of heart disease and stroke. If your triglyceride levels are high, choosing the right foods can help lower your triglycerides and keep your heart healthy. Work with your health care provider or a dietitian to develop an eating plan that is right for you. ?What are tips for following this plan? ?General guidelines ? ?Lose weight, if you are overweight. For most people, losing 5-10 lb (2-5 kg) helps lower triglyceride levels. A weight-loss plan may include: ?30 minutes of exercise at least 5 days a week. ?Reducing the amount of calories, sugar, and fat you eat. ?Eat a wide variety of fresh fruits, vegetables, and whole grains. These foods are high in fiber. ?Eat foods that contain healthy fats, such as fatty fish, nuts, seeds, and olive oil. ?Avoid foods that are high in added sugar, added salt (sodium), and saturated fat. ?Avoid low-fiber, refined carbohydrates such as white bread, crackers, noodles, and white rice. ?Avoid foods with trans fats or partially hydrogenated oils, such as fried foods or stick margarine. ?If you drink alcohol: ?Limit how much you have to: ?0-1 drink a day for women who are not pregnant. ?0-2 drinks a day for men. ?Your health care provider may recommend that you drink less than these amounts depending on your overall health. ?Know how much alcohol is in a drink. In the [place] one drink equals one 12 oz bottle of beer (355 mL), one 5 oz glass of wine (148 mL), or one 1? oz glass of hard liquor (44 mL). ?Reading food labels ?Check food labels for: ?The amount of saturated fat. Choose foods with no or very little saturated fat (less than 2 g). ?The amount of trans fat. Choose foods with no transfat. ?The amount of cholesterol. Choose foods that are low in cholesterol. ?The amount of sodium. Choose foods with less than 140 milligrams (mg) per serving. ?Shopping ?Buy  dairy products labeled as nonfat (skim) or low-fat (1%). ?Avoid buying processed or prepackaged foods. These are often high in added sugar, sodium, and fat. ?Cooking ?Choose healthy fats when cooking, such as olive oil, avocado oil, or canola oil. ?Cook foods using lower fat methods, such as baking, broiling, boiling, or grilling. ?Make your own sauces, dressings, and marinades when possible, instead of buying them. Store-bought sauces, dressings, and marinades are often high in sodium and sugar. ?Meal planning ?Eat more home-cooked food and less restaurant, buffet, and fast food. ?Eat fatty fish at least 2 times each week. Examples of fatty fish include salmon, trout, sardines, mackerel, tuna, and herring. ?If you eat whole eggs, do not eat more than 4 egg yolks per week. ?What foods should I eat? ?Fruits ?All fresh, canned (in natural juice), or frozen fruits. ?Vegetables ?Fresh or frozen vegetables. Low-sodium canned vegetables. ?Grains ?Whole wheat or whole grain breads, crackers, cereals, and pasta. Unsweetened oatmeal. Bulgur. Barley. Quinoa. Brown rice. Whole wheat flour tortillas. ?Meats and other proteins ?Skinless chicken or turkey. Ground chicken or turkey. Lean cuts of pork, trimmed of fat. Fish and seafood, especially salmon, trout, and herring. Egg whites. Dried beans, peas, or lentils. Unsalted nuts or seeds. Unsalted canned beans. Natural peanut or almond butter or other nut butters. ?Dairy ?Low-fat dairy products. Skim or low-fat (1%) milk. Reduced fat (2%) and low-sodium cheese. Low-fat ricotta cheese. Low-fat cottage cheese. Plain, low-fat yogurt. ?Fats and oils ?Tub margarine without trans fats. Light or reduced-fat mayonnaise. Light or   reduced-fat salad dressings. Avocado. Safflower, olive, sunflower, soybean, and canola oils. The items listed above may not be a complete list of recommended foods and beverages. Talk with your dietitian about what dietary choices are best for you. What foods  should I avoid? Fruits Sweetened dried fruit. Canned fruit in syrup. Fruit juice. Vegetables Creamed or fried vegetables. Vegetables in a cheese sauce. Grains White bread. White (regular) pasta. White rice. Cornbread. Bagels. Pastries. Crackers that contain trans fat. Meats and other proteins Fatty cuts of meat. Ribs. Chicken wings. [place]. Sausage. Bologna. Salami. Chitterlings. Fatback. Hot dogs. Bratwurst. Packaged lunch meats. Dairy Whole or reduced-fat (2%) milk. Half-and-half. Cream cheese. Full-fat or sweetened yogurt. Full-fat cheese. Nondairy creamers. Whipped toppings. Processed cheese or cheese spreads. Cheese curds. Fats and oils Butter. Stick margarine. Lard. Shortening. Ghee. Bacon fat. Tropical oils, such as coconut, palm kernel, or palm oils. Beverages Alcohol. Sweetened drinks, such as soda, lemonade, fruit drinks, or punches. Sweets and desserts Corn syrup. Sugars. Honey. Molasses. Candy. Jam and jelly. Syrup. Sweetened cereals. Cookies. Pies. Cakes. Donuts. Muffins. Ice cream. Condiments Store-bought sauces, dressings, and marinades that are high in sugar, such as ketchup and barbecue sauce. The items listed above may not be a complete list of foods and beverages you should avoid. Talk with your dietitian about what dietary choices are best for you. Summary High levels of triglycerides can increase the risk of heart disease and stroke. Choosing the right foods can help lower your triglycerides. Eat plenty of fresh fruits, vegetables, and whole grains. Choose low-fat dairy and lean meats. Eat fatty fish at least twice a week. Avoid processed and prepackaged foods with added sugar, sodium, saturated fat, and trans fat. If you need suggestions or have questions about what types of food are good for you, talk with your health care provider or a dietitian. This information is not intended to replace advice given to you by your health care provider. Make sure you discuss any  questions you have with your health care provider. Document Revised: 08/18/2020 Document Reviewed: 08/18/2020 [employer] Patient Education  2024 [employer].

## 2024-04-02 ENCOUNTER — Ambulatory Visit: Payer: Self-pay | Admitting: Family

## 2024-04-02 LAB — MICROALBUMIN / CREATININE URINE RATIO
Creatinine, Urine: 20.7 mg/dL
Microalb/Creat Ratio: <14 mg/g{creat} (ref 0–29)
Microalbumin, Urine: <3 ug/mL

## 2024-04-07 ENCOUNTER — Other Ambulatory Visit: Payer: Self-pay | Admitting: Family

## 2024-04-07 DIAGNOSIS — I10 Essential (primary) hypertension: Secondary | ICD-10-CM

## 2024-04-20 ENCOUNTER — Other Ambulatory Visit: Payer: Self-pay | Admitting: Family

## 2024-05-03 ENCOUNTER — Other Ambulatory Visit: Payer: Self-pay | Admitting: Family

## 2024-05-03 DIAGNOSIS — E1169 Type 2 diabetes mellitus with other specified complication: Secondary | ICD-10-CM

## 2024-05-25 ENCOUNTER — Ambulatory Visit: Payer: Medicare Other

## 2024-07-01 ENCOUNTER — Ambulatory Visit

## 2024-09-30 ENCOUNTER — Ambulatory Visit: Admitting: Family
